# Patient Record
Sex: Female | Born: 1940 | Race: White | Hispanic: No | Smoking: Former smoker
Health system: Southern US, Community
[De-identification: ages and names within clinical notes are randomized; demographics above are authoritative.]

## PROBLEM LIST (undated history)

## (undated) DIAGNOSIS — N179 Acute kidney failure, unspecified: Secondary | ICD-10-CM

## (undated) DIAGNOSIS — I10 Essential (primary) hypertension: Secondary | ICD-10-CM

## (undated) DIAGNOSIS — K5720 Diverticulitis of large intestine with perforation and abscess without bleeding: Secondary | ICD-10-CM

## (undated) DIAGNOSIS — M109 Gout, unspecified: Secondary | ICD-10-CM

## (undated) DIAGNOSIS — M1990 Unspecified osteoarthritis, unspecified site: Secondary | ICD-10-CM

## (undated) DIAGNOSIS — Z8489 Family history of other specified conditions: Secondary | ICD-10-CM

## (undated) DIAGNOSIS — J45909 Unspecified asthma, uncomplicated: Secondary | ICD-10-CM

## (undated) DIAGNOSIS — E119 Type 2 diabetes mellitus without complications: Secondary | ICD-10-CM

## (undated) HISTORY — DX: Acute kidney failure, unspecified: N17.9

## (undated) HISTORY — PX: KNEE SURGERY: SHX244

## (undated) HISTORY — PX: ABDOMINAL HYSTERECTOMY: SHX81

## (undated) HISTORY — DX: Diverticulitis of large intestine with perforation and abscess without bleeding: K57.20

---

## 2014-05-21 DIAGNOSIS — E119 Type 2 diabetes mellitus without complications: Secondary | ICD-10-CM | POA: Diagnosis not present

## 2014-06-15 DIAGNOSIS — Z0100 Encounter for examination of eyes and vision without abnormal findings: Secondary | ICD-10-CM | POA: Diagnosis not present

## 2014-12-22 ENCOUNTER — Emergency Department
Admission: EM | Admit: 2014-12-22 | Discharge: 2014-12-22 | Disposition: A | Discharge status: Home / Self Care | Payer: Commercial Managed Care - HMO | Attending: Student | Admitting: Student

## 2014-12-22 ENCOUNTER — Encounter: Payer: Self-pay | Admitting: Emergency Medicine

## 2014-12-22 DIAGNOSIS — M545 Low back pain: Secondary | ICD-10-CM | POA: Diagnosis not present

## 2014-12-22 DIAGNOSIS — N3000 Acute cystitis without hematuria: Secondary | ICD-10-CM | POA: Insufficient documentation

## 2014-12-22 LAB — URINALYSIS COMPLETE WITH MICROSCOPIC (ARMC ONLY)
BILIRUBIN URINE: NEGATIVE
GLUCOSE, UA: NEGATIVE mg/dL
Hgb urine dipstick: NEGATIVE
Ketones, ur: NEGATIVE mg/dL
Nitrite: NEGATIVE
Protein, ur: NEGATIVE mg/dL
Specific Gravity, Urine: 1.025 (ref 1.005–1.030)
pH: 5 (ref 5.0–8.0)

## 2014-12-22 MED ORDER — TRAMADOL HCL 50 MG PO TABS: 50.0000 mg | ORAL_TABLET | Freq: Four times a day (QID) | ORAL | Status: DC | PRN

## 2014-12-22 MED ORDER — SULFAMETHOXAZOLE-TRIMETHOPRIM 800-160 MG PO TABS: 1.0000 | ORAL_TABLET | Freq: Two times a day (BID) | ORAL | Status: DC

## 2014-12-22 NOTE — ED Provider Notes (Signed)
[facility] Emergency Department Provider Note ____________________________________________  Time seen: Approximately 2:59 PM  I have reviewed the triage vital signs and the nursing notes.   HISTORY  Chief Complaint Back Pain   HPI Brenda Gregory is a 74 y.o. female who presents to the emergency department for evaluation of left lower back pain. She denies injury. She has no dysuria or frequency. She states the pain has progressively worsened over the past 24 hours. The pain does not radiate. She is a Scientist, water quality at [employer]. She states that the pain worsens when she has to stand for long periods and twist or turn certain ways. She has taken Tylenol arthritis without relief. She denies previous back issues.   History reviewed. No pertinent past medical history.  There are no active problems to display for this patient.   Past Surgical History  Procedure Laterality Date  . Cesarean section    . Knee surgery Left     Current Outpatient Rx  Name  Route  Sig  Dispense  Refill  . sulfamethoxazole-trimethoprim (BACTRIM DS,SEPTRA DS) 800-160 MG per tablet   Oral   Take 1 tablet by mouth 2 (two) times daily.   10 tablet   0   . traMADol (ULTRAM) 50 MG tablet   Oral   Take 1 tablet (50 mg total) by mouth every 6 (six) hours as needed.   9 tablet   0     Allergies Review of patient's allergies indicates no known allergies.  No family history on file.  Social History Social History  Substance Use Topics  . Smoking status: Never Smoker   . Smokeless tobacco: None  . Alcohol Use: No    Review of Systems Constitutional: No recent illness. Eyes: No visual changes. ENT: No sore throat. Cardiovascular: Denies chest pain or palpitations. Respiratory: Denies shortness of breath. Gastrointestinal: No abdominal pain.  Genitourinary: Negative for dysuria. Musculoskeletal: Pain in left lower back Skin: Negative for rash. Neurological: Negative for  headaches, focal weakness or numbness. 10-point ROS otherwise negative.  ____________________________________________   PHYSICAL EXAM:  VITAL SIGNS: ED Triage Vitals  Enc Vitals Group     BP 12/22/14 1334 129/75 mmHg     Pulse Rate 12/22/14 1334 71     Resp 12/22/14 1334 16     Temp 12/22/14 1334 98.2 F (36.8 C)     Temp Source 12/22/14 1334 Oral     SpO2 12/22/14 1334 98 %     Weight 12/22/14 1334 225 lb (102.059 kg)     Height 12/22/14 1334 5\' 5"  (1.651 m)     Head Cir --      Peak Flow --      Pain Score 12/22/14 1334 8     Pain Loc --      Pain Edu? --      Excl. in [place]? --     Constitutional: Alert and oriented. Well appearing and in no acute distress. Eyes: Conjunctivae are normal. EOMI. Head: Atraumatic. Nose: No congestion/rhinnorhea. Neck: No stridor.  Respiratory: Normal respiratory effort.   Musculoskeletal: Left lower back pain reproducible with straight leg raise at 30. No CVA tenderness elicited. Neurologic:  Normal speech and language. No gross focal neurologic deficits are appreciated. Speech is normal. No gait instability. Skin:  Skin is warm, dry and intact. Atraumatic. Psychiatric: Mood and affect are normal. Speech and behavior are normal.  ____________________________________________   LABS (all labs ordered are listed, but only abnormal results are displayed)  Labs Reviewed  URINALYSIS COMPLETEWITH MICROSCOPIC ([facility] ONLY) - Abnormal; Notable for the following:    Color, Urine YELLOW (*)    APPearance HAZY (*)    Leukocytes, UA 2+ (*)    Bacteria, UA MANY (*)    Squamous Epithelial / LPF 6-30 (*)    All other components within normal limits   ____________________________________________  RADIOLOGY  Not indicated ____________________________________________   PROCEDURES  Procedure(s) performed: None   ____________________________________________   INITIAL IMPRESSION / ASSESSMENT AND PLAN / ED COURSE  Pertinent labs & imaging  results that were available during my care of the patient were reviewed by me and considered in my medical decision making (see chart for details).  Patient was advised to follow up with the primary care provider in 10-14 days for a repeat urinalysis. She was advised to return to the emergency department for symptoms that change or worsen if unable to schedule an appointment with the primary care provider or specialist. ____________________________________________   FINAL CLINICAL IMPRESSION(S) / ED DIAGNOSES  Final diagnoses:  Acute cystitis without hematuria       Victorino Dike, FNP 12/22/14 1521  Joanne Gavel, MD 12/22/14 1529

## 2014-12-22 NOTE — Discharge Instructions (Signed)

## 2014-12-22 NOTE — ED Notes (Signed)
Patient presents to the ED with back pain x 2 days.  Patient states pain is mainly on the left side of her back.  Patient denies any dysuria or urinary frequency.  Patient states she believes she may have strained her back at work.  Denies fall or major trauma.  Patient is in no obvious distress at this time.

## 2014-12-22 NOTE — ED Notes (Signed)
Pt discharged home after verbalizing understanding of discharge instructions; nad noted. 

## 2015-01-13 DIAGNOSIS — R55 Syncope and collapse: Secondary | ICD-10-CM | POA: Diagnosis not present

## 2015-01-13 DIAGNOSIS — E119 Type 2 diabetes mellitus without complications: Secondary | ICD-10-CM | POA: Diagnosis not present

## 2015-01-13 DIAGNOSIS — M109 Gout, unspecified: Secondary | ICD-10-CM | POA: Diagnosis not present

## 2015-01-13 DIAGNOSIS — N3000 Acute cystitis without hematuria: Secondary | ICD-10-CM | POA: Diagnosis not present

## 2015-01-13 DIAGNOSIS — N179 Acute kidney failure, unspecified: Secondary | ICD-10-CM

## 2015-01-13 DIAGNOSIS — R110 Nausea: Secondary | ICD-10-CM | POA: Diagnosis not present

## 2015-01-13 DIAGNOSIS — R944 Abnormal results of kidney function studies: Secondary | ICD-10-CM | POA: Diagnosis not present

## 2015-01-13 DIAGNOSIS — R0600 Dyspnea, unspecified: Secondary | ICD-10-CM | POA: Diagnosis not present

## 2015-01-13 DIAGNOSIS — I10 Essential (primary) hypertension: Secondary | ICD-10-CM | POA: Diagnosis not present

## 2015-01-13 DIAGNOSIS — E869 Volume depletion, unspecified: Secondary | ICD-10-CM | POA: Diagnosis not present

## 2015-01-13 HISTORY — DX: Acute kidney failure, unspecified: N17.9

## 2015-01-18 ENCOUNTER — Emergency Department
Admission: EM | Admit: 2015-01-18 | Discharge: 2015-01-19 | Disposition: A | Discharge status: Home / Self Care | Payer: Commercial Managed Care - HMO | Attending: Emergency Medicine | Admitting: Emergency Medicine

## 2015-01-18 ENCOUNTER — Encounter: Payer: Self-pay | Admitting: Adult Health

## 2015-01-18 DIAGNOSIS — Y998 Other external cause status: Secondary | ICD-10-CM | POA: Diagnosis not present

## 2015-01-18 DIAGNOSIS — X58XXXA Exposure to other specified factors, initial encounter: Secondary | ICD-10-CM | POA: Diagnosis not present

## 2015-01-18 DIAGNOSIS — Y9389 Activity, other specified: Secondary | ICD-10-CM | POA: Diagnosis not present

## 2015-01-18 DIAGNOSIS — S59911A Unspecified injury of right forearm, initial encounter: Secondary | ICD-10-CM | POA: Diagnosis present

## 2015-01-18 DIAGNOSIS — S161XXA Strain of muscle, fascia and tendon at neck level, initial encounter: Secondary | ICD-10-CM | POA: Insufficient documentation

## 2015-01-18 DIAGNOSIS — Z79899 Other long term (current) drug therapy: Secondary | ICD-10-CM | POA: Insufficient documentation

## 2015-01-18 DIAGNOSIS — Y9289 Other specified places as the place of occurrence of the external cause: Secondary | ICD-10-CM | POA: Insufficient documentation

## 2015-01-18 DIAGNOSIS — T148XXA Other injury of unspecified body region, initial encounter: Secondary | ICD-10-CM

## 2015-01-18 DIAGNOSIS — L7622 Postprocedural hemorrhage and hematoma of skin and subcutaneous tissue following other procedure: Secondary | ICD-10-CM | POA: Insufficient documentation

## 2015-01-18 DIAGNOSIS — S301XXA Contusion of abdominal wall, initial encounter: Secondary | ICD-10-CM | POA: Diagnosis not present

## 2015-01-18 LAB — CBC
HCT: 38.5 % (ref 35.0–47.0)
Hemoglobin: 12.6 g/dL (ref 12.0–16.0)
MCH: 29.9 pg (ref 26.0–34.0)
MCHC: 32.8 g/dL (ref 32.0–36.0)
MCV: 91 fL (ref 80.0–100.0)
PLATELETS: 256 10*3/uL (ref 150–440)
RBC: 4.23 MIL/uL (ref 3.80–5.20)
RDW: 14.6 % — AB (ref 11.5–14.5)
WBC: 12.8 10*3/uL — ABNORMAL HIGH (ref 3.6–11.0)

## 2015-01-18 LAB — BASIC METABOLIC PANEL
Anion gap: 9 (ref 5–15)
BUN: 24 mg/dL — AB (ref 6–20)
CALCIUM: 9.3 mg/dL (ref 8.9–10.3)
CO2: 23 mmol/L (ref 22–32)
Chloride: 101 mmol/L (ref 101–111)
Creatinine, Ser: 1.29 mg/dL — ABNORMAL HIGH (ref 0.44–1.00)
GFR calc Af Amer: 47 mL/min — ABNORMAL LOW (ref >60)
GFR, EST NON AFRICAN AMERICAN: 40 mL/min — AB (ref >60)
GLUCOSE: 186 mg/dL — AB (ref 65–99)
Potassium: 3.9 mmol/L (ref 3.5–5.1)
Sodium: 133 mmol/L — ABNORMAL LOW (ref 135–145)

## 2015-01-18 LAB — TROPONIN I

## 2015-01-18 NOTE — ED Notes (Signed)
Attempted to insert IV 2 x unsuccessful.

## 2015-01-18 NOTE — ED Notes (Signed)
Pt reports having had a UTI at the end of September.  Pt reports going to the [place] fair Sunday, and experienced dizziness. Pt went to [facility], and labs and blood cultures drawn and reports being told she still has a UTI.  Pt was given 300mg  capsules of Cefdinir.  Pt began began having neck stiffness/pain yesterday.  Pt states "it feels kind of like when you sleep wrong".  Pt reports having right arm pain upon waking up this morning.  Pt has bruising to both AC's which she reports is from blood draws at [place].  Pt's daughter reports that pt has not checked her blood sugar today.  She reports that she normally takes metformin, but was told by the MD's at [facility] not to take it until she has her follow-up appointment with her primary care MD.  Pt's appointment with primary care is scheduled for this Monday.

## 2015-01-18 NOTE — ED Notes (Addendum)
Recenly hospitalized for dehydration at [facility] Monday had blood drawn from right arm, today right arm is painful to move at the elbow-pain is described as severe- no swelling noted. Bruising at site. Also c/o neck stiffness on right side. Denies headaches, denies fevers.

## 2015-01-18 NOTE — ED Provider Notes (Signed)
[facility] Emergency Department Provider Note  ____________________________________________  Time seen: Approximately 11:22 PM  I have reviewed the triage vital signs and the nursing notes.   HISTORY  Chief Complaint Arm Pain and Neck Pain    HPI Brenda Gregory is a 74 y.o. female with a history of a urinary tract infection diagnosed a couple weeks ago and then a recent admission to [facility] when she became lightheaded and near syncopal at the [employer] state fair.She was treated for mild dehydration and discharged.  Today she complains that the right side of her neck feels stiff "like when he sleep on it wrong" though it does feel better with icy hot.  Her primary/chief complaint is pain in her right inner elbow at the site where she had an IV was she was in the hospital.  She states it is sore when she tries to move it.  She describes the pain as mild to moderate and worse with movement and better with rest.  The onset has been gradual.  She denies fever/chills, chest pain, shortness of breath, abdominal pain, dysuria, nausea/vomiting.  She states that she still feels a little bit weak after her recent hospitalization.  She also denies headaches and visual symptoms.   History reviewed. No pertinent past medical history.  There are no active problems to display for this patient.   Past Surgical History  Procedure Laterality Date  . Cesarean section    . Knee surgery Left     Current Outpatient Rx  Name  Route  Sig  Dispense  Refill  . sulfamethoxazole-trimethoprim (BACTRIM DS,SEPTRA DS) 800-160 MG per tablet   Oral   Take 1 tablet by mouth 2 (two) times daily.   10 tablet   0   . traMADol (ULTRAM) 50 MG tablet   Oral   Take 1 tablet (50 mg total) by mouth every 6 (six) hours as needed.   9 tablet   0     Allergies Review of patient's allergies indicates no known allergies.  History reviewed. No pertinent family  history.  Social History Social History  Substance Use Topics  . Smoking status: Never Smoker   . Smokeless tobacco: None  . Alcohol Use: No    Review of Systems Constitutional: No fever/chills Eyes: No visual changes. ENT: No sore throat. Cardiovascular: Denies chest pain. Respiratory: Denies shortness of breath. Gastrointestinal: No abdominal pain.  No nausea, no vomiting.  No diarrhea.  No constipation. Genitourinary: Negative for dysuria. Musculoskeletal: Negative for back pain.  Pain in her right antecubital fossa after recent IV during hospitalization at [facility].  Pain in the right side of her neck  Skin: Negative for rash. Neurological: Negative for headaches, focal weakness or numbness.  10-point ROS otherwise negative.  ____________________________________________   PHYSICAL EXAM:  VITAL SIGNS: ED Triage Vitals  Enc Vitals Group     BP 01/18/15 2008 149/76 mmHg     Pulse Rate 01/18/15 2008 94     Resp 01/18/15 2008 18     Temp 01/18/15 2008 98.8 F (37.1 C)     Temp Source 01/18/15 2008 Oral     SpO2 01/18/15 2008 96 %     Weight --      Height --      Head Cir --      Peak Flow --      Pain Score 01/18/15 2008 8     Pain Loc --      Pain Edu? --  Excl. in [place]? --     Constitutional: Alert and oriented. Well appearing and in no acute distress. Eyes: Conjunctivae are normal. PERRL. EOMI. Head: Atraumatic. Nose: No congestion/rhinnorhea. Mouth/Throat: Mucous membranes are moist.  Oropharynx non-erythematous. Neck: No stridor.  No cervical spine tenderness to palpation.  Soft tissue muscle tenderness on the right side.  No meningismus. Cardiovascular: Normal rate, regular rhythm. Grossly normal heart sounds.  Good peripheral circulation. Respiratory: Normal respiratory effort.  No retractions. Lungs CTAB. Gastrointestinal: Soft and nontender. No distention. No abdominal bruits. No CVA tenderness. Musculoskeletal: Ecchymosis/hematoma in the right  antecubital fossa with mild tenderness to palpation and with flexion and extension.  However there is no effusion and no sign of cellulitis or phlebitis.  She is neurovascularly intact distal to the wound. Neurologic:  Normal speech and language. No gross focal neurologic deficits are appreciated.  Skin:  Skin is warm, dry and intact. No rash noted. Psychiatric: Mood and affect are normal. Speech and behavior are normal.  ____________________________________________   LABS (all labs ordered are listed, but only abnormal results are displayed)  Labs Reviewed  BASIC METABOLIC PANEL - Abnormal; Notable for the following:    Sodium 133 (*)    Glucose, Bld 186 (*)    BUN 24 (*)    Creatinine, Ser 1.29 (*)    GFR calc non Af Amer 40 (*)    GFR calc Af Amer 47 (*)    All other components within normal limits  CBC - Abnormal; Notable for the following:    WBC 12.8 (*)    RDW 14.6 (*)    All other components within normal limits  TROPONIN I   ____________________________________________  EKG  ED ECG REPORT I, Dusty Wagoner, the attending physician, personally viewed and interpreted this ECG.  Date: 01/19/2015 EKG Time: 22:45 Rate: 86 Rhythm: normal sinus rhythm QRS Axis: Left axis deviation Intervals: normal ST/T Wave abnormalities: normal Conduction Disutrbances: none Narrative Interpretation: unremarkable  ____________________________________________  RADIOLOGY   No results found.  ____________________________________________   PROCEDURES  Procedure(s) performed: None  Critical Care performed: No ____________________________________________   INITIAL IMPRESSION / ASSESSMENT AND PLAN / ED COURSE  Pertinent labs & imaging results that were available during my care of the patient were reviewed by me and considered in my medical decision making (see chart for details).  The patient is well-appearing and in no acute distress with normal vital signs.  Her labs are  within normal limits; the leukocytosis is nonspecific but not concerning at this time.  She is currently on antibiotics to treat her urinary tract infection.  She has a follow-up appointment in 2 days with her primary care doctor.  I offered radiographs of the right elbow but explained that they were not really indicated and that she should use elevation, compression, and cold packs to help with a hematoma after her peripheral IV stick.  She understands and agrees with the plan.  I gave her my usual and customary return precautions.  ____________________________________________  FINAL CLINICAL IMPRESSION(S) / ED DIAGNOSES  Final diagnoses:  Hematoma  Muscle strain      NEW MEDICATIONS STARTED DURING THIS VISIT:  New Prescriptions   No medications on file     Hinda Kehr, MD 01/19/15 [phone]

## 2015-01-19 NOTE — ED Notes (Signed)
Reviewed d/c instructions, follow-up care, and medications with pt.  Pt verbalized understanding.

## 2015-01-19 NOTE — Discharge Instructions (Signed)
As we discussed, your workup today was reassuring.  Use elevation, compression, and ice packs to help the swelling in your right inner elbow, and keep doing what you are doing for the muscle stiffness in your neck.  Follow up with your PCP on Monday as scheduled.  Please return immediately to the Emergency Department if you develop any new or worsening symptoms that concern you.

## 2015-01-21 DIAGNOSIS — N390 Urinary tract infection, site not specified: Secondary | ICD-10-CM | POA: Diagnosis not present

## 2015-01-21 DIAGNOSIS — N183 Chronic kidney disease, stage 3 unspecified: Secondary | ICD-10-CM | POA: Insufficient documentation

## 2015-01-21 DIAGNOSIS — E119 Type 2 diabetes mellitus without complications: Secondary | ICD-10-CM | POA: Diagnosis not present

## 2015-01-21 DIAGNOSIS — J45909 Unspecified asthma, uncomplicated: Secondary | ICD-10-CM | POA: Insufficient documentation

## 2015-01-21 DIAGNOSIS — M542 Cervicalgia: Secondary | ICD-10-CM | POA: Diagnosis not present

## 2015-01-21 DIAGNOSIS — Z79899 Other long term (current) drug therapy: Secondary | ICD-10-CM | POA: Diagnosis not present

## 2015-01-21 DIAGNOSIS — Z23 Encounter for immunization: Secondary | ICD-10-CM | POA: Diagnosis not present

## 2015-04-15 DIAGNOSIS — I10 Essential (primary) hypertension: Secondary | ICD-10-CM | POA: Diagnosis not present

## 2015-04-15 DIAGNOSIS — Z79899 Other long term (current) drug therapy: Secondary | ICD-10-CM | POA: Diagnosis not present

## 2015-04-15 DIAGNOSIS — E119 Type 2 diabetes mellitus without complications: Secondary | ICD-10-CM | POA: Diagnosis not present

## 2015-04-15 DIAGNOSIS — E1122 Type 2 diabetes mellitus with diabetic chronic kidney disease: Secondary | ICD-10-CM | POA: Diagnosis not present

## 2015-04-15 DIAGNOSIS — N183 Chronic kidney disease, stage 3 (moderate): Secondary | ICD-10-CM | POA: Diagnosis not present

## 2015-07-02 ENCOUNTER — Emergency Department: Payer: Commercial Managed Care - HMO

## 2015-07-02 ENCOUNTER — Encounter: Payer: Self-pay | Admitting: Emergency Medicine

## 2015-07-02 DIAGNOSIS — M25561 Pain in right knee: Secondary | ICD-10-CM | POA: Insufficient documentation

## 2015-07-02 DIAGNOSIS — Z5321 Procedure and treatment not carried out due to patient leaving prior to being seen by health care provider: Secondary | ICD-10-CM | POA: Diagnosis not present

## 2015-07-02 NOTE — ED Notes (Signed)
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

## 2015-07-03 ENCOUNTER — Emergency Department
Admission: EM | Admit: 2015-07-03 | Discharge: 2015-07-03 | Disposition: A | Discharge status: Home / Self Care | Payer: Commercial Managed Care - HMO | Attending: Emergency Medicine | Admitting: Emergency Medicine

## 2015-07-03 NOTE — ED Notes (Signed)
Pt sitting in flex lobby with family member; no distress noted; updated on wait time & voices understanding

## 2015-10-08 DIAGNOSIS — E1122 Type 2 diabetes mellitus with diabetic chronic kidney disease: Secondary | ICD-10-CM | POA: Diagnosis not present

## 2015-10-08 DIAGNOSIS — N183 Chronic kidney disease, stage 3 (moderate): Secondary | ICD-10-CM | POA: Diagnosis not present

## 2015-10-08 DIAGNOSIS — Z79899 Other long term (current) drug therapy: Secondary | ICD-10-CM | POA: Diagnosis not present

## 2015-10-22 DIAGNOSIS — Z6841 Body Mass Index (BMI) 40.0 and over, adult: Secondary | ICD-10-CM | POA: Diagnosis not present

## 2015-10-22 DIAGNOSIS — Z79899 Other long term (current) drug therapy: Secondary | ICD-10-CM | POA: Diagnosis not present

## 2015-10-22 DIAGNOSIS — Z1211 Encounter for screening for malignant neoplasm of colon: Secondary | ICD-10-CM | POA: Diagnosis not present

## 2015-10-22 DIAGNOSIS — N183 Chronic kidney disease, stage 3 (moderate): Secondary | ICD-10-CM | POA: Diagnosis not present

## 2015-10-22 DIAGNOSIS — E1122 Type 2 diabetes mellitus with diabetic chronic kidney disease: Secondary | ICD-10-CM | POA: Diagnosis not present

## 2015-10-22 DIAGNOSIS — Z0000 Encounter for general adult medical examination without abnormal findings: Secondary | ICD-10-CM | POA: Diagnosis not present

## 2015-12-03 DIAGNOSIS — H5213 Myopia, bilateral: Secondary | ICD-10-CM | POA: Diagnosis not present

## 2015-12-03 DIAGNOSIS — H52209 Unspecified astigmatism, unspecified eye: Secondary | ICD-10-CM | POA: Diagnosis not present

## 2015-12-03 DIAGNOSIS — Z0100 Encounter for examination of eyes and vision without abnormal findings: Secondary | ICD-10-CM | POA: Diagnosis not present

## 2015-12-03 DIAGNOSIS — H524 Presbyopia: Secondary | ICD-10-CM | POA: Diagnosis not present

## 2015-12-03 DIAGNOSIS — H5210 Myopia, unspecified eye: Secondary | ICD-10-CM | POA: Diagnosis not present

## 2016-01-03 DIAGNOSIS — H2513 Age-related nuclear cataract, bilateral: Secondary | ICD-10-CM | POA: Diagnosis not present

## 2016-02-18 DIAGNOSIS — H2513 Age-related nuclear cataract, bilateral: Secondary | ICD-10-CM | POA: Diagnosis not present

## 2016-02-18 NOTE — Discharge Instructions (Signed)
Cataract Surgery, Care After °Refer to this sheet in the next few weeks. These instructions provide you with information about caring for yourself after your procedure. Your health care provider may also give you more specific instructions. Your treatment has been planned according to current medical practices, but problems sometimes occur. Call your health care provider if you have any problems or questions after your procedure. °What can I expect after the procedure? °After the procedure, it is common to have: °· Itching. °· Discomfort. °· Fluid discharge. °· Sensitivity to light and to touch. °· Bruising. °Follow these instructions at home: °Eye Care  °· Check your eye every day for signs of infection. Watch for: °¨ Redness, swelling, or pain. °¨ Fluid, blood, or pus. °¨ Warmth. °¨ Bad smell. °Activity  °· Avoid strenuous activities, such as playing contact sports, for as long as told by your health care provider. °· Do not drive or operate heavy machinery until your health care provider approves. °· Do not bend or lift heavy objects . Bending increases pressure in the eye. You can walk, climb stairs, and do light household chores. °· Ask your health care provider when you can return to work. If you work in a dusty environment, you may be advised to wear protective eyewear for a period of time. °General instructions  °· Take or apply over-the-counter and prescription medicines only as told by your health care provider. This includes eye drops. °· Do not touch or rub your eyes. °· If you were given a protective shield, wear it as told by your health care provider. If you were not given a protective shield, wear sunglasses as told by your health care provider to protect your eyes. °· Keep the area around your eye clean and dry. Avoid swimming or allowing water to hit you directly in the face while showering until told by your health care provider. Keep soap and shampoo out of your eyes. °· Do not put a contact lens  into the affected eye or eyes until your health care provider approves. °· Keep all follow-up visits as told by your health care provider. This is important. °Contact a health care provider if: ° °· You have increased bruising around your eye. °· You have pain that is not helped with medicine. °· You have a fever. °· You have redness, swelling, or pain in your eye. °· You have fluid, blood, or pus coming from your incision. °· Your vision gets worse. °Get help right away if: °· You have sudden vision loss. °This information is not intended to replace advice given to you by your health care provider. Make sure you discuss any questions you have with your health care provider. °Document Released: 10/03/2004 Document Revised: 07/25/2015 Document Reviewed: 01/24/2015 °Elsevier Interactive Patient Education © 2017 [employer] ° ° ° ° °General Anesthesia, Adult, Care After °These instructions provide you with information about caring for yourself after your procedure. Your health care provider may also give you more specific instructions. Your treatment has been planned according to current medical practices, but problems sometimes occur. Call your health care provider if you have any problems or questions after your procedure. °What can I expect after the procedure? °After the procedure, it is common to have: °· Vomiting. °· A sore throat. °· Mental slowness. °It is common to feel: °· Nauseous. °· Cold or shivery. °· Sleepy. °· Tired. °· Sore or achy, even in parts of your body where you did not have surgery. °Follow these instructions at   home: °For at least 24 hours after the procedure:  °· Do not: °¨ Participate in activities where you could fall or become injured. °¨ Drive. °¨ Use heavy machinery. °¨ Drink alcohol. °¨ Take sleeping pills or medicines that cause drowsiness. °¨ Make important decisions or sign legal documents. °¨ Take care of children on your own. °· Rest. °Eating and drinking  °· If you vomit, drink  water, juice, or soup when you can drink without vomiting. °· Drink enough fluid to keep your urine clear or pale yellow. °· Make sure you have little or no nausea before eating solid foods. °· Follow the diet recommended by your health care provider. °General instructions  °· Have a responsible adult stay with you until you are awake and alert. °· Return to your normal activities as told by your health care provider. Ask your health care provider what activities are safe for you. °· Take over-the-counter and prescription medicines only as told by your health care provider. °· If you smoke, do not smoke without supervision. °· Keep all follow-up visits as told by your health care provider. This is important. °Contact a health care provider if: °· You continue to have nausea or vomiting at home, and medicines are not helpful. °· You cannot drink fluids or start eating again. °· You cannot urinate after 8-12 hours. °· You develop a skin rash. °· You have fever. °· You have increasing redness at the site of your procedure. °Get help right away if: °· You have difficulty breathing. °· You have chest pain. °· You have unexpected bleeding. °· You feel that you are having a life-threatening or urgent problem. °This information is not intended to replace advice given to you by your health care provider. Make sure you discuss any questions you have with your health care provider. °Document Released: 06/22/2000 Document Revised: 08/19/2015 Document Reviewed: 02/28/2015 °Elsevier Interactive Patient Education © 2017 Elsevier Inc[employer] ° °

## 2016-02-19 ENCOUNTER — Encounter: Payer: Self-pay | Admitting: *Deleted

## 2016-02-25 ENCOUNTER — Encounter
Admission: RE | Disposition: A | Discharge status: Home / Self Care | Payer: Self-pay | Source: Ambulatory Visit | Attending: Ophthalmology

## 2016-02-25 ENCOUNTER — Ambulatory Visit
Admission: RE | Admit: 2016-02-25 | Discharge: 2016-02-25 | Disposition: A | Discharge status: Home / Self Care | Payer: Commercial Managed Care - HMO | Source: Ambulatory Visit | Attending: Ophthalmology | Admitting: Ophthalmology

## 2016-02-25 ENCOUNTER — Ambulatory Visit: Payer: Commercial Managed Care - HMO | Admitting: Anesthesiology

## 2016-02-25 DIAGNOSIS — E1136 Type 2 diabetes mellitus with diabetic cataract: Secondary | ICD-10-CM | POA: Diagnosis not present

## 2016-02-25 DIAGNOSIS — I10 Essential (primary) hypertension: Secondary | ICD-10-CM | POA: Insufficient documentation

## 2016-02-25 DIAGNOSIS — Z87891 Personal history of nicotine dependence: Secondary | ICD-10-CM | POA: Insufficient documentation

## 2016-02-25 DIAGNOSIS — J45909 Unspecified asthma, uncomplicated: Secondary | ICD-10-CM | POA: Insufficient documentation

## 2016-02-25 DIAGNOSIS — M1990 Unspecified osteoarthritis, unspecified site: Secondary | ICD-10-CM | POA: Insufficient documentation

## 2016-02-25 DIAGNOSIS — H2513 Age-related nuclear cataract, bilateral: Secondary | ICD-10-CM | POA: Diagnosis not present

## 2016-02-25 DIAGNOSIS — H2512 Age-related nuclear cataract, left eye: Secondary | ICD-10-CM | POA: Diagnosis not present

## 2016-02-25 HISTORY — DX: Unspecified osteoarthritis, unspecified site: M19.90

## 2016-02-25 HISTORY — DX: Essential (primary) hypertension: I10

## 2016-02-25 HISTORY — PX: CATARACT EXTRACTION W/PHACO: SHX586

## 2016-02-25 HISTORY — DX: Type 2 diabetes mellitus without complications: E11.9

## 2016-02-25 HISTORY — DX: Unspecified asthma, uncomplicated: J45.909

## 2016-02-25 HISTORY — DX: Gout, unspecified: M10.9

## 2016-02-25 LAB — GLUCOSE, CAPILLARY: GLUCOSE-CAPILLARY: 140 mg/dL — AB (ref 65–99)

## 2016-02-25 SURGERY — PHACOEMULSIFICATION, CATARACT, WITH IOL INSERTION
Anesthesia: Monitor Anesthesia Care | Laterality: Left | Wound class: Clean

## 2016-02-25 MED ORDER — ARMC OPHTHALMIC DILATING DROPS
1.0000 "application " | OPHTHALMIC | Status: DC | PRN
  Administered 2016-02-25 (×3): 1 via OPHTHALMIC

## 2016-02-25 MED ORDER — SODIUM HYALURONATE 10 MG/ML IO SOLN
INTRAOCULAR | Status: DC | PRN
  Administered 2016-02-25: 0.85 mL via INTRAOCULAR

## 2016-02-25 MED ORDER — BSS IO SOLN
INTRAOCULAR | Status: DC | PRN
  Administered 2016-02-25: 1 mL via OPHTHALMIC

## 2016-02-25 MED ORDER — FENTANYL CITRATE (PF) 100 MCG/2ML IJ SOLN
INTRAMUSCULAR | Status: DC | PRN
Start: 2016-02-25 — End: 2016-02-25
  Administered 2016-02-25: 50 ug via INTRAVENOUS

## 2016-02-25 MED ORDER — SODIUM HYALURONATE 23 MG/ML IO SOLN
INTRAOCULAR | Status: DC | PRN
  Administered 2016-02-25: 0.6 mL via INTRAOCULAR

## 2016-02-25 MED ORDER — MOXIFLOXACIN HCL 0.5 % OP SOLN
OPHTHALMIC | Status: DC | PRN
  Administered 2016-02-25: 1 [drp] via OPHTHALMIC

## 2016-02-25 MED ORDER — EPINEPHRINE PF 1 MG/ML IJ SOLN
INTRAOCULAR | Status: DC | PRN
  Administered 2016-02-25: 89 mL via OPHTHALMIC

## 2016-02-25 MED ORDER — MIDAZOLAM HCL 2 MG/2ML IJ SOLN
INTRAMUSCULAR | Status: DC | PRN
Start: 2016-02-25 — End: 2016-02-25
  Administered 2016-02-25: 2 mg via INTRAVENOUS

## 2016-02-25 SURGICAL SUPPLY — 19 items
CANNULA ANT/CHMB 27GA (MISCELLANEOUS) ×3 IMPLANT
CUP MEDICINE 2OZ PLAST GRAD ST (MISCELLANEOUS) ×3 IMPLANT
DISSECTOR HYDRO NUCLEUS 50X22 (MISCELLANEOUS) ×3 IMPLANT
GLOVE BIO SURGEON STRL SZ8 (GLOVE) ×3 IMPLANT
GLOVE SURG LX 7.5 STRW (GLOVE) ×2
GLOVE SURG LX STRL 7.5 STRW (GLOVE) ×1 IMPLANT
GOWN STRL REUS W/ TWL LRG LVL3 (GOWN DISPOSABLE) ×2 IMPLANT
GOWN STRL REUS W/TWL LRG LVL3 (GOWN DISPOSABLE) ×4
LENS IOL TECNIS ITEC 16.0 (Intraocular Lens) ×3 IMPLANT
MARKER SKIN DUAL TIP RULER LAB (MISCELLANEOUS) ×3 IMPLANT
PACK CATARACT (MISCELLANEOUS) ×3 IMPLANT
PACK CATARACT BRASINGTON (MISCELLANEOUS) ×3 IMPLANT
PACK EYE AFTER SURG (MISCELLANEOUS) ×3 IMPLANT
SOL PREP PVP 2OZ (MISCELLANEOUS) ×3
SOLUTION PREP PVP 2OZ (MISCELLANEOUS) ×1 IMPLANT
SYR 3ML LL SCALE MARK (SYRINGE) ×3 IMPLANT
SYR TB 1ML LUER SLIP (SYRINGE) ×3 IMPLANT
WATER STERILE IRR 250ML POUR (IV SOLUTION) ×3 IMPLANT
WIPE NON LINTING 3.25X3.25 (MISCELLANEOUS) ×3 IMPLANT

## 2016-02-25 NOTE — Op Note (Signed)
OPERATIVE NOTE  Brenda Gregory [id number] 02/25/2016   PREOPERATIVE DIAGNOSIS:  Nuclear sclerotic cataract left eye.  H25.12   POSTOPERATIVE DIAGNOSIS:    Nuclear sclerotic cataract left eye.     PROCEDURE:  Phacoemusification with posterior chamber intraocular lens placement of the left eye   LENS:   Implant Name Type Inv. Item Serial No. Manufacturer Lot No. LRB No. Used  LENS IOL DIOP 16.0 - [id number] Intraocular Lens LENS IOL DIOP 16.0 [id number] AMO   Left 1       PCB00 +16.0 D   ULTRASOUND TIME: 0 minutes 31.6 seconds.  CDE 4.45   SURGEON:  Benay Pillow, MD, MPH   ANESTHESIA:  Topical with tetracaine drops augmented with 1% preservative-free intracameral lidocaine.   COMPLICATIONS:  None.   DESCRIPTION OF PROCEDURE:  The patient was identified in the holding room and transported to the operating room and placed in the supine position under the operating microscope.  The left eye was identified as the operative eye and it was prepped and draped in the usual sterile ophthalmic fashion.   A 1.0 millimeter clear-corneal paracentesis was made at the 5:00 position. 0.5 ml of preservative-free 1% lidocaine with epinephrine was injected into the anterior chamber.  The anterior chamber was filled with Healon 5 viscoelastic.  A 2.4 millimeter keratome was used to make a near-clear corneal incision at the 2:00 position.  A curvilinear capsulorrhexis was made with a cystotome and capsulorrhexis forceps.  Balanced salt solution was used to hydrodissect and hydrodelineate the nucleus.   Phacoemulsification was then used in stop and chop fashion to remove the lens nucleus and epinucleus.  The remaining cortex was then removed using the irrigation and aspiration handpiece. Healon was then placed into the capsular bag to distend it for lens placement.  A lens was then injected into the capsular bag.  The remaining viscoelastic was aspirated.   Wounds were hydrated with balanced salt  solution.  The anterior chamber was inflated to a physiologic pressure with balanced salt solution.   Intracameral vigamox 0.1 mL undiltued was injected into the eye and a drop placed onto the ocular surface.  No wound leaks were noted.  The patient was taken to the recovery room in stable condition without complications of anesthesia or surgery  Benay Pillow 02/25/2016, 9:55 AM

## 2016-02-25 NOTE — Anesthesia Postprocedure Evaluation (Signed)
Anesthesia Post Note  Patient: Brenda Gregory  Procedure(s) Performed: Procedure(s) (LRB): CATARACT EXTRACTION PHACO AND INTRAOCULAR LENS PLACEMENT (IOC) (Left)  Patient location during evaluation: PACU Anesthesia Type: MAC Level of consciousness: awake and alert Pain management: pain level controlled Vital Signs Assessment: post-procedure vital signs reviewed and stable Respiratory status: spontaneous breathing, nonlabored ventilation, respiratory function stable and patient connected to nasal cannula oxygen Cardiovascular status: stable and blood pressure returned to baseline Anesthetic complications: no    Alisa Graff

## 2016-02-25 NOTE — Anesthesia Preprocedure Evaluation (Signed)
Anesthesia Evaluation  Patient identified by MRN, date of birth, ID band Patient awake    Reviewed: Allergy & Precautions, H&P , NPO status , Patient's Chart, lab work & pertinent test results, reviewed documented beta blocker date and time   Airway Mallampati: II  TM Distance: >3 FB Neck ROM: full    Dental no notable dental hx.    Pulmonary asthma , former smoker,    Pulmonary exam normal breath sounds clear to auscultation       Cardiovascular Exercise Tolerance: Good hypertension,  Rhythm:regular Rate:Normal     Neuro/Psych negative neurological ROS  negative psych ROS   GI/Hepatic negative GI ROS, Neg liver ROS,   Endo/Other  diabetes  Renal/GU negative Renal ROS  negative genitourinary   Musculoskeletal  (+) Arthritis ,   Abdominal   Peds  Hematology negative hematology ROS (+)   Anesthesia Other Findings   Reproductive/Obstetrics negative OB ROS                             Anesthesia Physical Anesthesia Plan  ASA: II  Anesthesia Plan: MAC   Post-op Pain Management:    Induction:   Airway Management Planned:   Additional Equipment:   Intra-op Plan:   Post-operative Plan:   Informed Consent: I have reviewed the patients History and Physical, chart, labs and discussed the procedure including the risks, benefits and alternatives for the proposed anesthesia with the patient or authorized representative who has indicated his/her understanding and acceptance.   Dental Advisory Given  Plan Discussed with: CRNA  Anesthesia Plan Comments:         Anesthesia Quick Evaluation

## 2016-02-25 NOTE — H&P (Signed)
The History and Physical notes are on paper, have been signed, and are to be scanned. The patient remains stable and unchanged from the H&P.   Previous H&P reviewed, patient examined, and there are no changes.  Brenda Gregory 02/25/2016 9:03 AM

## 2016-02-25 NOTE — Transfer of Care (Signed)
Immediate Anesthesia Transfer of Care Note  Patient: Brenda Gregory  Procedure(s) Performed: Procedure(s) with comments: CATARACT EXTRACTION PHACO AND INTRAOCULAR LENS PLACEMENT (IOC) (Left) - DIABETIC - oral meds LEFT  Patient Location: PACU  Anesthesia Type: MAC  Level of Consciousness: awake, alert  and patient cooperative  Airway and Oxygen Therapy: Patient Spontanous Breathing and Patient connected to supplemental oxygen  Post-op Assessment: Post-op Vital signs reviewed, Patient's Cardiovascular Status Stable, Respiratory Function Stable, Patent Airway and No signs of Nausea or vomiting  Post-op Vital Signs: Reviewed and stable  Complications: No apparent anesthesia complications

## 2016-02-25 NOTE — Anesthesia Procedure Notes (Signed)
Procedure Name: MAC Performed by: Dietra Stokely Pre-anesthesia Checklist: Patient identified, Emergency Drugs available, Suction available, Timeout performed and Patient being monitored Patient Re-evaluated:Patient Re-evaluated prior to inductionOxygen Delivery Method: Nasal cannula Placement Confirmation: positive ETCO2     

## 2016-02-26 ENCOUNTER — Encounter: Payer: Self-pay | Admitting: Ophthalmology

## 2016-03-27 DIAGNOSIS — H2511 Age-related nuclear cataract, right eye: Secondary | ICD-10-CM | POA: Diagnosis not present

## 2016-03-31 ENCOUNTER — Encounter: Payer: Self-pay | Admitting: *Deleted

## 2016-04-01 ENCOUNTER — Encounter: Payer: Self-pay | Admitting: Anesthesiology

## 2016-04-06 NOTE — Discharge Instructions (Signed)
Cataract Surgery, Care After °Refer to this sheet in the next few weeks. These instructions provide you with information about caring for yourself after your procedure. Your health care provider may also give you more specific instructions. Your treatment has been planned according to current medical practices, but problems sometimes occur. Call your health care provider if you have any problems or questions after your procedure. °What can I expect after the procedure? °After the procedure, it is common to have: °· Itching. °· Discomfort. °· Fluid discharge. °· Sensitivity to light and to touch. °· Bruising. °Follow these instructions at home: °Eye Care  °· Check your eye every day for signs of infection. Watch for: °¨ Redness, swelling, or pain. °¨ Fluid, blood, or pus. °¨ Warmth. °¨ Bad smell. °Activity  °· Avoid strenuous activities, such as playing contact sports, for as long as told by your health care provider. °· Do not drive or operate heavy machinery until your health care provider approves. °· Do not bend or lift heavy objects . Bending increases pressure in the eye. You can walk, climb stairs, and do light household chores. °· Ask your health care provider when you can return to work. If you work in a dusty environment, you may be advised to wear protective eyewear for a period of time. °General instructions  °· Take or apply over-the-counter and prescription medicines only as told by your health care provider. This includes eye drops. °· Do not touch or rub your eyes. °· If you were given a protective shield, wear it as told by your health care provider. If you were not given a protective shield, wear sunglasses as told by your health care provider to protect your eyes. °· Keep the area around your eye clean and dry. Avoid swimming or allowing water to hit you directly in the face while showering until told by your health care provider. Keep soap and shampoo out of your eyes. °· Do not put a contact lens  into the affected eye or eyes until your health care provider approves. °· Keep all follow-up visits as told by your health care provider. This is important. °Contact a health care provider if: ° °· You have increased bruising around your eye. °· You have pain that is not helped with medicine. °· You have a fever. °· You have redness, swelling, or pain in your eye. °· You have fluid, blood, or pus coming from your incision. °· Your vision gets worse. °Get help right away if: °· You have sudden vision loss. °This information is not intended to replace advice given to you by your health care provider. Make sure you discuss any questions you have with your health care provider. °Document Released: 10/03/2004 Document Revised: 07/25/2015 Document Reviewed: 01/24/2015 °Elsevier Interactive Patient Education © 2017 [employer] ° ° ° ° °General Anesthesia, Adult, Care After °These instructions provide you with information about caring for yourself after your procedure. Your health care provider may also give you more specific instructions. Your treatment has been planned according to current medical practices, but problems sometimes occur. Call your health care provider if you have any problems or questions after your procedure. °What can I expect after the procedure? °After the procedure, it is common to have: °· Vomiting. °· A sore throat. °· Mental slowness. °It is common to feel: °· Nauseous. °· Cold or shivery. °· Sleepy. °· Tired. °· Sore or achy, even in parts of your body where you did not have surgery. °Follow these instructions at   home: °For at least 24 hours after the procedure:  °· Do not: °¨ Participate in activities where you could fall or become injured. °¨ Drive. °¨ Use heavy machinery. °¨ Drink alcohol. °¨ Take sleeping pills or medicines that cause drowsiness. °¨ Make important decisions or sign legal documents. °¨ Take care of children on your own. °· Rest. °Eating and drinking  °· If you vomit, drink  water, juice, or soup when you can drink without vomiting. °· Drink enough fluid to keep your urine clear or pale yellow. °· Make sure you have little or no nausea before eating solid foods. °· Follow the diet recommended by your health care provider. °General instructions  °· Have a responsible adult stay with you until you are awake and alert. °· Return to your normal activities as told by your health care provider. Ask your health care provider what activities are safe for you. °· Take over-the-counter and prescription medicines only as told by your health care provider. °· If you smoke, do not smoke without supervision. °· Keep all follow-up visits as told by your health care provider. This is important. °Contact a health care provider if: °· You continue to have nausea or vomiting at home, and medicines are not helpful. °· You cannot drink fluids or start eating again. °· You cannot urinate after 8-12 hours. °· You develop a skin rash. °· You have fever. °· You have increasing redness at the site of your procedure. °Get help right away if: °· You have difficulty breathing. °· You have chest pain. °· You have unexpected bleeding. °· You feel that you are having a life-threatening or urgent problem. °This information is not intended to replace advice given to you by your health care provider. Make sure you discuss any questions you have with your health care provider. °Document Released: 06/22/2000 Document Revised: 08/19/2015 Document Reviewed: 02/28/2015 °Elsevier Interactive Patient Education © 2017 Elsevier Inc[employer] ° °

## 2016-04-07 ENCOUNTER — Encounter
Admission: RE | Disposition: A | Discharge status: Home / Self Care | Payer: Self-pay | Source: Ambulatory Visit | Attending: Ophthalmology

## 2016-04-07 ENCOUNTER — Ambulatory Visit: Payer: Commercial Managed Care - HMO | Admitting: Anesthesiology

## 2016-04-07 ENCOUNTER — Ambulatory Visit
Admission: RE | Admit: 2016-04-07 | Discharge: 2016-04-07 | Disposition: A | Discharge status: Home / Self Care | Payer: Commercial Managed Care - HMO | Source: Ambulatory Visit | Attending: Ophthalmology | Admitting: Ophthalmology

## 2016-04-07 DIAGNOSIS — E119 Type 2 diabetes mellitus without complications: Secondary | ICD-10-CM | POA: Diagnosis not present

## 2016-04-07 DIAGNOSIS — Z79899 Other long term (current) drug therapy: Secondary | ICD-10-CM | POA: Insufficient documentation

## 2016-04-07 DIAGNOSIS — I10 Essential (primary) hypertension: Secondary | ICD-10-CM | POA: Insufficient documentation

## 2016-04-07 DIAGNOSIS — M109 Gout, unspecified: Secondary | ICD-10-CM | POA: Insufficient documentation

## 2016-04-07 DIAGNOSIS — Z7984 Long term (current) use of oral hypoglycemic drugs: Secondary | ICD-10-CM | POA: Diagnosis not present

## 2016-04-07 DIAGNOSIS — H2511 Age-related nuclear cataract, right eye: Secondary | ICD-10-CM | POA: Diagnosis not present

## 2016-04-07 DIAGNOSIS — Z87891 Personal history of nicotine dependence: Secondary | ICD-10-CM | POA: Insufficient documentation

## 2016-04-07 HISTORY — PX: CATARACT EXTRACTION W/PHACO: SHX586

## 2016-04-07 LAB — GLUCOSE, CAPILLARY
GLUCOSE-CAPILLARY: 142 mg/dL — AB (ref 65–99)
Glucose-Capillary: 118 mg/dL — ABNORMAL HIGH (ref 65–99)

## 2016-04-07 SURGERY — PHACOEMULSIFICATION, CATARACT, WITH IOL INSERTION
Anesthesia: Monitor Anesthesia Care | Site: Eye | Laterality: Right | Wound class: Clean

## 2016-04-07 MED ORDER — BSS IO SOLN
INTRAOCULAR | Status: DC | PRN
  Administered 2016-04-07: 2 mL via OPHTHALMIC

## 2016-04-07 MED ORDER — LACTATED RINGERS IV SOLN: INTRAVENOUS | Status: DC

## 2016-04-07 MED ORDER — LACTATED RINGERS IV SOLN: 500.0000 mL | INTRAVENOUS | Status: DC

## 2016-04-07 MED ORDER — ARMC OPHTHALMIC DILATING DROPS
1.0000 "application " | OPHTHALMIC | Status: DC | PRN
  Administered 2016-04-07 (×3): 1 via OPHTHALMIC

## 2016-04-07 MED ORDER — MOXIFLOXACIN HCL 0.5 % OP SOLN
OPHTHALMIC | Status: DC | PRN
  Administered 2016-04-07: 1 [drp] via OPHTHALMIC

## 2016-04-07 MED ORDER — FENTANYL CITRATE (PF) 100 MCG/2ML IJ SOLN
INTRAMUSCULAR | Status: DC | PRN
  Administered 2016-04-07: 100 ug via INTRAVENOUS

## 2016-04-07 MED ORDER — ACETAMINOPHEN 325 MG PO TABS: 325.0000 mg | ORAL_TABLET | ORAL | Status: DC | PRN

## 2016-04-07 MED ORDER — SODIUM HYALURONATE 10 MG/ML IO SOLN
INTRAOCULAR | Status: DC | PRN
  Administered 2016-04-07: .55 mL via INTRAOCULAR

## 2016-04-07 MED ORDER — ACETAMINOPHEN 160 MG/5ML PO SOLN: 325.0000 mg | ORAL | Status: DC | PRN

## 2016-04-07 MED ORDER — SODIUM HYALURONATE 23 MG/ML IO SOLN
INTRAOCULAR | Status: DC | PRN
  Administered 2016-04-07: 0.6 mL via INTRAOCULAR

## 2016-04-07 MED ORDER — MIDAZOLAM HCL 2 MG/2ML IJ SOLN
INTRAMUSCULAR | Status: DC | PRN
  Administered 2016-04-07: 2 mg via INTRAVENOUS

## 2016-04-07 MED ORDER — MOXIFLOXACIN HCL 0.5 % OP SOLN: 1.0000 [drp] | OPHTHALMIC | Status: DC | PRN

## 2016-04-07 MED ORDER — EPINEPHRINE PF 1 MG/ML IJ SOLN
INTRAOCULAR | Status: DC | PRN
  Administered 2016-04-07: 90 mL via OPHTHALMIC

## 2016-04-07 SURGICAL SUPPLY — 19 items
CANNULA ANT/CHMB 27GA (MISCELLANEOUS) ×3 IMPLANT
CUP MEDICINE 2OZ PLAST GRAD ST (MISCELLANEOUS) ×3 IMPLANT
DISSECTOR HYDRO NUCLEUS 50X22 (MISCELLANEOUS) ×3 IMPLANT
GLOVE BIO SURGEON STRL SZ8 (GLOVE) ×3 IMPLANT
GLOVE SURG LX 7.5 STRW (GLOVE) ×2
GLOVE SURG LX STRL 7.5 STRW (GLOVE) ×1 IMPLANT
GOWN STRL REUS W/ TWL LRG LVL3 (GOWN DISPOSABLE) ×2 IMPLANT
GOWN STRL REUS W/TWL LRG LVL3 (GOWN DISPOSABLE) ×4
LENS IOL TECNIS ITEC 17.5 (Intraocular Lens) ×3 IMPLANT
MARKER SKIN DUAL TIP RULER LAB (MISCELLANEOUS) ×3 IMPLANT
PACK CATARACT (MISCELLANEOUS) ×3 IMPLANT
PACK CATARACT BRASINGTON (MISCELLANEOUS) ×3 IMPLANT
PACK EYE AFTER SURG (MISCELLANEOUS) ×3 IMPLANT
SOL PREP PVP 2OZ (MISCELLANEOUS) ×3
SOLUTION PREP PVP 2OZ (MISCELLANEOUS) ×1 IMPLANT
SYR 3ML LL SCALE MARK (SYRINGE) ×3 IMPLANT
SYR TB 1ML LUER SLIP (SYRINGE) ×3 IMPLANT
WATER STERILE IRR 250ML POUR (IV SOLUTION) ×3 IMPLANT
WIPE NON LINTING 3.25X3.25 (MISCELLANEOUS) ×3 IMPLANT

## 2016-04-07 NOTE — H&P (Signed)
The History and Physical notes are on paper, have been signed, and are to be scanned. The patient remains stable and unchanged from the H&P.   Previous H&P reviewed, patient examined, and there are no changes.  Benay Pillow 04/07/2016 8:04 AM

## 2016-04-07 NOTE — Transfer of Care (Signed)
Immediate Anesthesia Transfer of Care Note  Patient: Brenda Gregory  Procedure(s) Performed: Procedure(s) with comments: CATARACT EXTRACTION PHACO AND INTRAOCULAR LENS PLACEMENT (IOC) (Right) - RIGHT DIABETES - oral meds IVA TOPICAL  Patient Location: PACU  Anesthesia Type: MAC  Level of Consciousness: awake, alert  and patient cooperative  Airway and Oxygen Therapy: Patient Spontanous Breathing and Patient connected to supplemental oxygen  Post-op Assessment: Post-op Vital signs reviewed, Patient's Cardiovascular Status Stable, Respiratory Function Stable, Patent Airway and No signs of Nausea or vomiting  Post-op Vital Signs: Reviewed and stable  Complications: No apparent anesthesia complications

## 2016-04-07 NOTE — Anesthesia Postprocedure Evaluation (Signed)
Anesthesia Post Note  Patient: Brenda Gregory  Procedure(s) Performed: Procedure(s) (LRB): CATARACT EXTRACTION PHACO AND INTRAOCULAR LENS PLACEMENT (IOC) (Right)  Patient location during evaluation: PACU Anesthesia Type: MAC Level of consciousness: awake and alert Pain management: pain level controlled Vital Signs Assessment: post-procedure vital signs reviewed and stable Respiratory status: spontaneous breathing, nonlabored ventilation and respiratory function stable Cardiovascular status: stable and blood pressure returned to baseline Anesthetic complications: no    Ehab Humber D Fatoumata Albaugh

## 2016-04-07 NOTE — Anesthesia Procedure Notes (Signed)
Procedure Name: MAC Date/Time: 04/07/2016 8:18 AM Performed by: Janna Arch Pre-anesthesia Checklist: Patient identified, Emergency Drugs available, Suction available and Patient being monitored Patient Re-evaluated:Patient Re-evaluated prior to inductionOxygen Delivery Method: Nasal cannula

## 2016-04-07 NOTE — Op Note (Signed)
OPERATIVE NOTE  SHARMAN GARROTT [id number] 04/07/2016   PREOPERATIVE DIAGNOSIS:  Nuclear sclerotic cataract right eye.  H25.11   POSTOPERATIVE DIAGNOSIS:    Nuclear sclerotic cataract right eye.     PROCEDURE:  Phacoemusification with posterior chamber intraocular lens placement of the right eye   LENS:   Implant Name Type Inv. Item Serial No. Manufacturer Lot No. LRB No. Used  LENS IOL DIOP 17.5 - [id number] Intraocular Lens LENS IOL DIOP 17.5 [id number] AMO   Right 1       PCB00 +17.5   ULTRASOUND TIME: 0 minutes 23 seconds.  CDE 3.87   SURGEON:  Benay Pillow, MD, MPH  ANESTHESIOLOGIST: Anesthesiologist: Esmeralda Links, MD CRNA: Janna Arch, CRNA   ANESTHESIA:  Topical with tetracaine drops augmented with 1% preservative-free intracameral lidocaine.  ESTIMATED BLOOD LOSS: less than 1 mL.   COMPLICATIONS:  None.   DESCRIPTION OF PROCEDURE:  The patient was identified in the holding room and transported to the operating room and placed in the supine position under the operating microscope.  The right eye was identified as the operative eye and it was prepped and draped in the usual sterile ophthalmic fashion.   A 1.0 millimeter clear-corneal paracentesis was made at the 10:30 position. 0.5 ml of preservative-free 1% lidocaine with epinephrine was injected into the anterior chamber.  The anterior chamber was filled with Healon 5 viscoelastic.  A 2.4 millimeter keratome was used to make a near-clear corneal incision at the 8:00 position.  A curvilinear capsulorrhexis was made with a cystotome and capsulorrhexis forceps.  Balanced salt solution was used to hydrodissect and hydrodelineate the nucleus.   Phacoemulsification was then used in stop and chop fashion to remove the lens nucleus and epinucleus.  The remaining cortex was then removed using the irrigation and aspiration handpiece. Healon was then placed into the capsular bag to distend it for lens placement.  A lens  was then injected into the capsular bag.  The remaining viscoelastic was aspirated.   Wounds were hydrated with balanced salt solution.  The anterior chamber was inflated to a physiologic pressure with balanced salt solution.   Intracameral vigamox 0.1 mL undiluted was injected into the eye and a drop placed onto the ocular surface.  No wound leaks were noted.  The patient was taken to the recovery room in stable condition without complications of anesthesia or surgery.  Good routine case.  Benay Pillow 04/07/2016, 8:42 AM

## 2016-04-07 NOTE — Anesthesia Preprocedure Evaluation (Signed)
Anesthesia Evaluation    Reviewed: Allergy & Precautions, H&P , Patient's Chart, lab work & pertinent test results, reviewed documented beta blocker date and time   Airway Mallampati: III  TM Distance: >3 FB Neck ROM: full    Dental   Pulmonary former smoker,    Pulmonary exam normal        Cardiovascular hypertension, Normal cardiovascular exam     Neuro/Psych    GI/Hepatic   Endo/Other  diabetes  Renal/GU      Musculoskeletal   Abdominal   Peds  Hematology   Anesthesia Other Findings   Reproductive/Obstetrics                             Anesthesia Physical Anesthesia Plan  ASA: III  Anesthesia Plan: MAC   Post-op Pain Management:    Induction:   Airway Management Planned:   Additional Equipment:   Intra-op Plan:   Post-operative Plan:   Informed Consent: I have reviewed the patients History and Physical, chart, labs and discussed the procedure including the risks, benefits and alternatives for the proposed anesthesia with the patient or authorized representative who has indicated his/her understanding and acceptance.     Plan Discussed with: CRNA  Anesthesia Plan Comments:         Anesthesia Quick Evaluation

## 2016-04-08 ENCOUNTER — Encounter: Payer: Self-pay | Admitting: Ophthalmology

## 2016-04-30 DIAGNOSIS — N183 Chronic kidney disease, stage 3 (moderate): Secondary | ICD-10-CM | POA: Diagnosis not present

## 2016-04-30 DIAGNOSIS — I10 Essential (primary) hypertension: Secondary | ICD-10-CM | POA: Diagnosis not present

## 2016-04-30 DIAGNOSIS — Z6841 Body Mass Index (BMI) 40.0 and over, adult: Secondary | ICD-10-CM | POA: Diagnosis not present

## 2016-04-30 DIAGNOSIS — E1122 Type 2 diabetes mellitus with diabetic chronic kidney disease: Secondary | ICD-10-CM | POA: Diagnosis not present

## 2016-04-30 DIAGNOSIS — Z79899 Other long term (current) drug therapy: Secondary | ICD-10-CM | POA: Diagnosis not present

## 2016-05-11 DIAGNOSIS — H52209 Unspecified astigmatism, unspecified eye: Secondary | ICD-10-CM | POA: Diagnosis not present

## 2016-05-11 DIAGNOSIS — H5203 Hypermetropia, bilateral: Secondary | ICD-10-CM | POA: Diagnosis not present

## 2016-05-11 DIAGNOSIS — H524 Presbyopia: Secondary | ICD-10-CM | POA: Diagnosis not present

## 2017-07-05 DIAGNOSIS — R1032 Left lower quadrant pain: Secondary | ICD-10-CM | POA: Diagnosis not present

## 2017-07-06 DIAGNOSIS — M1612 Unilateral primary osteoarthritis, left hip: Secondary | ICD-10-CM | POA: Diagnosis not present

## 2017-07-12 DIAGNOSIS — N183 Chronic kidney disease, stage 3 (moderate): Secondary | ICD-10-CM | POA: Diagnosis not present

## 2017-07-12 DIAGNOSIS — R102 Pelvic and perineal pain: Secondary | ICD-10-CM | POA: Diagnosis not present

## 2017-07-12 DIAGNOSIS — M25552 Pain in left hip: Secondary | ICD-10-CM | POA: Diagnosis not present

## 2017-07-12 DIAGNOSIS — Z79899 Other long term (current) drug therapy: Secondary | ICD-10-CM | POA: Diagnosis not present

## 2017-07-12 DIAGNOSIS — E1122 Type 2 diabetes mellitus with diabetic chronic kidney disease: Secondary | ICD-10-CM | POA: Diagnosis not present

## 2017-07-15 ENCOUNTER — Encounter: Payer: Self-pay | Admitting: Emergency Medicine

## 2017-07-15 ENCOUNTER — Emergency Department: Payer: Medicare HMO

## 2017-07-15 ENCOUNTER — Inpatient Hospital Stay
Admission: EM | Admit: 2017-07-15 | Discharge: 2017-07-24 | DRG: 330 | Disposition: A | Discharge status: Skilled Nursing Facility | Payer: Medicare HMO | Attending: Surgery | Admitting: Surgery

## 2017-07-15 ENCOUNTER — Other Ambulatory Visit: Payer: Self-pay

## 2017-07-15 DIAGNOSIS — N189 Chronic kidney disease, unspecified: Secondary | ICD-10-CM

## 2017-07-15 DIAGNOSIS — K651 Peritoneal abscess: Secondary | ICD-10-CM | POA: Diagnosis not present

## 2017-07-15 DIAGNOSIS — K5721 Diverticulitis of large intestine with perforation and abscess with bleeding: Secondary | ICD-10-CM | POA: Diagnosis not present

## 2017-07-15 DIAGNOSIS — E669 Obesity, unspecified: Secondary | ICD-10-CM | POA: Diagnosis present

## 2017-07-15 DIAGNOSIS — Z961 Presence of intraocular lens: Secondary | ICD-10-CM | POA: Diagnosis present

## 2017-07-15 DIAGNOSIS — M1990 Unspecified osteoarthritis, unspecified site: Secondary | ICD-10-CM | POA: Diagnosis present

## 2017-07-15 DIAGNOSIS — E46 Unspecified protein-calorie malnutrition: Secondary | ICD-10-CM | POA: Diagnosis not present

## 2017-07-15 DIAGNOSIS — Z9842 Cataract extraction status, left eye: Secondary | ICD-10-CM | POA: Diagnosis not present

## 2017-07-15 DIAGNOSIS — E876 Hypokalemia: Secondary | ICD-10-CM | POA: Diagnosis not present

## 2017-07-15 DIAGNOSIS — Z932 Ileostomy status: Secondary | ICD-10-CM | POA: Diagnosis not present

## 2017-07-15 DIAGNOSIS — M109 Gout, unspecified: Secondary | ICD-10-CM | POA: Diagnosis present

## 2017-07-15 DIAGNOSIS — N184 Chronic kidney disease, stage 4 (severe): Secondary | ICD-10-CM | POA: Diagnosis present

## 2017-07-15 DIAGNOSIS — I10 Essential (primary) hypertension: Secondary | ICD-10-CM | POA: Diagnosis not present

## 2017-07-15 DIAGNOSIS — E1122 Type 2 diabetes mellitus with diabetic chronic kidney disease: Secondary | ICD-10-CM | POA: Diagnosis not present

## 2017-07-15 DIAGNOSIS — R2681 Unsteadiness on feet: Secondary | ICD-10-CM | POA: Diagnosis not present

## 2017-07-15 DIAGNOSIS — K5720 Diverticulitis of large intestine with perforation and abscess without bleeding: Principal | ICD-10-CM

## 2017-07-15 DIAGNOSIS — I129 Hypertensive chronic kidney disease with stage 1 through stage 4 chronic kidney disease, or unspecified chronic kidney disease: Secondary | ICD-10-CM | POA: Diagnosis not present

## 2017-07-15 DIAGNOSIS — K8020 Calculus of gallbladder without cholecystitis without obstruction: Secondary | ICD-10-CM | POA: Diagnosis not present

## 2017-07-15 DIAGNOSIS — Z8249 Family history of ischemic heart disease and other diseases of the circulatory system: Secondary | ICD-10-CM | POA: Diagnosis not present

## 2017-07-15 DIAGNOSIS — J45909 Unspecified asthma, uncomplicated: Secondary | ICD-10-CM | POA: Diagnosis present

## 2017-07-15 DIAGNOSIS — Z5189 Encounter for other specified aftercare: Secondary | ICD-10-CM | POA: Diagnosis not present

## 2017-07-15 DIAGNOSIS — N179 Acute kidney failure, unspecified: Secondary | ICD-10-CM | POA: Diagnosis not present

## 2017-07-15 DIAGNOSIS — E119 Type 2 diabetes mellitus without complications: Secondary | ICD-10-CM | POA: Diagnosis not present

## 2017-07-15 DIAGNOSIS — K630 Abscess of intestine: Secondary | ICD-10-CM | POA: Diagnosis not present

## 2017-07-15 DIAGNOSIS — Z800 Family history of malignant neoplasm of digestive organs: Secondary | ICD-10-CM | POA: Diagnosis not present

## 2017-07-15 DIAGNOSIS — K632 Fistula of intestine: Secondary | ICD-10-CM | POA: Diagnosis present

## 2017-07-15 DIAGNOSIS — R1032 Left lower quadrant pain: Secondary | ICD-10-CM | POA: Diagnosis not present

## 2017-07-15 DIAGNOSIS — R1902 Left upper quadrant abdominal swelling, mass and lump: Secondary | ICD-10-CM | POA: Diagnosis not present

## 2017-07-15 DIAGNOSIS — E871 Hypo-osmolality and hyponatremia: Secondary | ICD-10-CM | POA: Diagnosis not present

## 2017-07-15 DIAGNOSIS — K567 Ileus, unspecified: Secondary | ICD-10-CM | POA: Diagnosis not present

## 2017-07-15 DIAGNOSIS — Z743 Need for continuous supervision: Secondary | ICD-10-CM | POA: Diagnosis not present

## 2017-07-15 DIAGNOSIS — Z7984 Long term (current) use of oral hypoglycemic drugs: Secondary | ICD-10-CM

## 2017-07-15 DIAGNOSIS — E860 Dehydration: Secondary | ICD-10-CM | POA: Diagnosis present

## 2017-07-15 DIAGNOSIS — Z6835 Body mass index (BMI) 35.0-35.9, adult: Secondary | ICD-10-CM

## 2017-07-15 DIAGNOSIS — Z9841 Cataract extraction status, right eye: Secondary | ICD-10-CM | POA: Diagnosis not present

## 2017-07-15 DIAGNOSIS — M6281 Muscle weakness (generalized): Secondary | ICD-10-CM | POA: Diagnosis not present

## 2017-07-15 DIAGNOSIS — K5780 Diverticulitis of intestine, part unspecified, with perforation and abscess without bleeding: Secondary | ICD-10-CM | POA: Diagnosis not present

## 2017-07-15 DIAGNOSIS — K760 Fatty (change of) liver, not elsewhere classified: Secondary | ICD-10-CM | POA: Diagnosis not present

## 2017-07-15 DIAGNOSIS — K631 Perforation of intestine (nontraumatic): Secondary | ICD-10-CM | POA: Diagnosis not present

## 2017-07-15 LAB — CBC
HCT: 40.9 % (ref 35.0–47.0)
HEMOGLOBIN: 13.4 g/dL (ref 12.0–16.0)
MCH: 28.8 pg (ref 26.0–34.0)
MCHC: 32.7 g/dL (ref 32.0–36.0)
MCV: 87.9 fL (ref 80.0–100.0)
PLATELETS: 430 10*3/uL (ref 150–440)
RBC: 4.65 MIL/uL (ref 3.80–5.20)
RDW: 14.8 % — ABNORMAL HIGH (ref 11.5–14.5)
WBC: 12.8 10*3/uL — ABNORMAL HIGH (ref 3.6–11.0)

## 2017-07-15 LAB — COMPREHENSIVE METABOLIC PANEL
ALK PHOS: 104 U/L (ref 38–126)
ALT: 58 U/L — ABNORMAL HIGH (ref 14–54)
ANION GAP: 9 (ref 5–15)
AST: 64 U/L — ABNORMAL HIGH (ref 15–41)
Albumin: 2.9 g/dL — ABNORMAL LOW (ref 3.5–5.0)
BILIRUBIN TOTAL: 0.9 mg/dL (ref 0.3–1.2)
BUN: 32 mg/dL — ABNORMAL HIGH (ref 6–20)
CALCIUM: 8.7 mg/dL — AB (ref 8.9–10.3)
CO2: 26 mmol/L (ref 22–32)
CREATININE: 1.85 mg/dL — AB (ref 0.44–1.00)
Chloride: 100 mmol/L — ABNORMAL LOW (ref 101–111)
GFR calc Af Amer: 30 mL/min — ABNORMAL LOW (ref >60)
GFR calc non Af Amer: 26 mL/min — ABNORMAL LOW (ref >60)
GLUCOSE: 161 mg/dL — AB (ref 65–99)
Potassium: 3.8 mmol/L (ref 3.5–5.1)
Sodium: 135 mmol/L (ref 135–145)
Total Protein: 8 g/dL (ref 6.5–8.1)

## 2017-07-15 LAB — LIPASE, BLOOD: Lipase: 23 U/L (ref 11–51)

## 2017-07-15 MED ORDER — MORPHINE SULFATE (PF) 2 MG/ML IV SOLN
2.0000 mg | INTRAVENOUS | Status: DC | PRN
  Administered 2017-07-15: 2 mg via INTRAVENOUS
  Filled 2017-07-15: qty 1

## 2017-07-15 MED ORDER — LISINOPRIL 20 MG PO TABS
20.0000 mg | ORAL_TABLET | Freq: Every day | ORAL | Status: DC
  Administered 2017-07-16 – 2017-07-24 (×8): 20 mg via ORAL
  Filled 2017-07-15 (×8): qty 1

## 2017-07-15 MED ORDER — MORPHINE SULFATE (PF) 2 MG/ML IV SOLN
2.0000 mg | INTRAVENOUS | Status: DC | PRN
  Administered 2017-07-16 – 2017-07-20 (×6): 2 mg via INTRAVENOUS
  Filled 2017-07-15 (×6): qty 1

## 2017-07-15 MED ORDER — ACETAMINOPHEN 325 MG PO TABS: 650.0000 mg | ORAL_TABLET | Freq: Four times a day (QID) | ORAL | Status: DC | PRN

## 2017-07-15 MED ORDER — LACTATED RINGERS IV BOLUS
1000.0000 mL | Freq: Once | INTRAVENOUS | Status: AC
  Administered 2017-07-15: 1000 mL via INTRAVENOUS

## 2017-07-15 MED ORDER — ENOXAPARIN SODIUM 40 MG/0.4ML ~~LOC~~ SOLN
40.0000 mg | SUBCUTANEOUS | Status: DC
  Administered 2017-07-15 – 2017-07-17 (×3): 40 mg via SUBCUTANEOUS
  Filled 2017-07-15 (×3): qty 0.4

## 2017-07-15 MED ORDER — DEXTROSE IN LACTATED RINGERS 5 % IV SOLN
INTRAVENOUS | Status: AC
  Administered 2017-07-15 – 2017-07-18 (×7): via INTRAVENOUS

## 2017-07-15 MED ORDER — PIPERACILLIN-TAZOBACTAM 3.375 G IVPB
3.3750 g | Freq: Three times a day (TID) | INTRAVENOUS | Status: DC
  Administered 2017-07-15 – 2017-07-24 (×24): 3.375 g via INTRAVENOUS
  Filled 2017-07-15 (×25): qty 50

## 2017-07-15 MED ORDER — ONDANSETRON 4 MG PO TBDP: 4.0000 mg | ORAL_TABLET | Freq: Four times a day (QID) | ORAL | Status: DC | PRN

## 2017-07-15 MED ORDER — SODIUM CHLORIDE 0.9 % IV BOLUS
1000.0000 mL | Freq: Once | INTRAVENOUS | Status: AC
  Administered 2017-07-15: 1000 mL via INTRAVENOUS

## 2017-07-15 MED ORDER — ACETAMINOPHEN 650 MG RE SUPP: 650.0000 mg | Freq: Four times a day (QID) | RECTAL | Status: DC | PRN

## 2017-07-15 MED ORDER — INSULIN ASPART 100 UNIT/ML ~~LOC~~ SOLN
0.0000 [IU] | SUBCUTANEOUS | Status: DC
  Administered 2017-07-16 (×2): 3 [IU] via SUBCUTANEOUS
  Administered 2017-07-16 (×2): 2 [IU] via SUBCUTANEOUS
  Administered 2017-07-16 (×2): 3 [IU] via SUBCUTANEOUS
  Administered 2017-07-17 (×2): 2 [IU] via SUBCUTANEOUS
  Administered 2017-07-17: 3 [IU] via SUBCUTANEOUS
  Administered 2017-07-17: 2 [IU] via SUBCUTANEOUS
  Administered 2017-07-18: 8 [IU] via SUBCUTANEOUS
  Administered 2017-07-18 (×2): 5 [IU] via SUBCUTANEOUS
  Administered 2017-07-19: 11 [IU] via SUBCUTANEOUS
  Administered 2017-07-19: 5 [IU] via SUBCUTANEOUS
  Administered 2017-07-19: 11 [IU] via SUBCUTANEOUS
  Administered 2017-07-19 (×3): 5 [IU] via SUBCUTANEOUS
  Administered 2017-07-20 – 2017-07-21 (×12): 3 [IU] via SUBCUTANEOUS
  Administered 2017-07-22 (×2): 2 [IU] via SUBCUTANEOUS
  Administered 2017-07-22: 3 [IU] via SUBCUTANEOUS
  Administered 2017-07-22: 2 [IU] via SUBCUTANEOUS
  Administered 2017-07-22 (×3): 3 [IU] via SUBCUTANEOUS
  Administered 2017-07-23 (×3): 2 [IU] via SUBCUTANEOUS
  Administered 2017-07-23: 3 [IU] via SUBCUTANEOUS
  Filled 2017-07-15 (×42): qty 1

## 2017-07-15 MED ORDER — ONDANSETRON HCL 4 MG/2ML IJ SOLN
4.0000 mg | Freq: Four times a day (QID) | INTRAMUSCULAR | Status: DC | PRN
  Filled 2017-07-15: qty 2

## 2017-07-15 MED ORDER — OXYCODONE HCL 5 MG PO TABS
5.0000 mg | ORAL_TABLET | ORAL | Status: DC | PRN
  Administered 2017-07-19 – 2017-07-23 (×11): 10 mg via ORAL
  Filled 2017-07-15 (×12): qty 2

## 2017-07-15 MED ORDER — ONDANSETRON HCL 4 MG/2ML IJ SOLN
4.0000 mg | Freq: Once | INTRAMUSCULAR | Status: AC
  Administered 2017-07-15: 4 mg via INTRAVENOUS
  Filled 2017-07-15: qty 2

## 2017-07-15 MED ORDER — PIPERACILLIN-TAZOBACTAM 3.375 G IVPB 30 MIN
3.3750 g | Freq: Once | INTRAVENOUS | Status: AC
  Administered 2017-07-15: 3.375 g via INTRAVENOUS
  Filled 2017-07-15: qty 50

## 2017-07-15 NOTE — ED Provider Notes (Signed)
[facility] Emergency Department Provider Note  Time seen: 3:39 PM  I have reviewed the triage vital signs and the nursing notes.   HISTORY  Chief Complaint Abdominal Pain    HPI Brenda Gregory is a 77 y.o. female with a past medical history of asthma, arthritis, diabetes, hypertension, presents to the emergency department for left lower quadrant abdominal pain.  According to the patient for the past 1.5 weeks she has been experiencing left lower quadrant abdominal pain, dull aching sensation, relieved somewhat by lying flat, worsening with any type of movement especially sitting up or ambulation.  States she has lost weight due to decreased appetite over the past 10 days as well.  Not drinking or eating much.  Patient does not really know why, states she is able to do so but just does not have the appetite to do so.  Denies any fever, cough, congestion, chest pain, vomiting, nausea, diarrhea dysuria or hematuria.  Largely negative review of systems.   Past Medical History:  Diagnosis Date  . Arthritis    knees, right foot  . Asthma    as child  . Diabetes mellitus without complication ([place])   . Gout   . Hypertension     There are no active problems to display for this patient.   Past Surgical History:  Procedure Laterality Date  . ABDOMINAL HYSTERECTOMY    . CATARACT EXTRACTION W/PHACO Left 02/25/2016   Procedure: CATARACT EXTRACTION PHACO AND INTRAOCULAR LENS PLACEMENT (IOC);  Surgeon: Eulogio Bear, MD;  Location: [place];  Service: Ophthalmology;  Laterality: Left;  DIABETIC - oral meds LEFT  . CATARACT EXTRACTION W/PHACO Right 04/07/2016   Procedure: CATARACT EXTRACTION PHACO AND INTRAOCULAR LENS PLACEMENT (IOC);  Surgeon: Eulogio Bear, MD;  Location: [place];  Service: Ophthalmology;  Laterality: Right;  RIGHT DIABETES - oral meds IVA TOPICAL  . KNEE SURGERY Left     Prior to Admission medications   Medication  Sig Start Date End Date Taking? Authorizing Provider  acetaminophen (TYLENOL) 650 MG CR tablet Take 650 mg by mouth as needed for pain.    [provider]  allopurinol (ZYLOPRIM) 300 MG tablet Take 300 mg by mouth daily.    [provider]  lisinopril (PRINIVIL,ZESTRIL) 20 MG tablet Take 20 mg by mouth daily.    [provider]  metFORMIN (GLUCOPHAGE) 500 MG tablet Take 500 mg by mouth daily with breakfast.    [provider]    No Known Allergies  History reviewed. No pertinent family history.  Social History Social History   Tobacco Use  . Smoking status: Former Smoker    Last attempt to quit: 03/30/1978    Years since quitting: 39.3  . Smokeless tobacco: Never Used  Substance Use Topics  . Alcohol use: No  . Drug use: Not on file    Review of Systems Constitutional: Negative for fever. Eyes: Negative for visual complaints ENT: Negative for recent illness/congestion Cardiovascular: Negative for chest pain. Respiratory: Negative for shortness of breath. Gastrointestinal: Dull left lower quadrant abdominal pain, moderate.  Negative for nausea vomiting diarrhea Genitourinary: Negative for urinary compaints Musculoskeletal: Negative for musculoskeletal complaints Skin: Negative for skin complaints  Neurological: Negative for headache All other ROS negative  ____________________________________________   PHYSICAL EXAM:  VITAL SIGNS: ED Triage Vitals  Enc Vitals Group     BP 07/15/17 1244 121/63     Pulse Rate 07/15/17 1244 97     Resp 07/15/17  1244 18     Temp 07/15/17 1244 98.8 F (37.1 C)     Temp src --      SpO2 07/15/17 1244 99 %     Weight 07/15/17 1244 200 lb (90.7 kg)     Height 07/15/17 1244 5\' 5"  (1.651 m)     Head Circumference --      Peak Flow --      Pain Score 07/15/17 1251 10     Pain Loc --      Pain Edu? --      Excl. in [place]? --    Constitutional: Alert and oriented. Well appearing and in no distress. Eyes:  Normal exam ENT   Head: Normocephalic and atraumatic.   Mouth/Throat: Mucous membranes are moist. Cardiovascular: Normal rate, regular rhythm. No murmur Respiratory: Normal respiratory effort without tachypnea nor retractions. Breath sounds are clear Gastrointestinal: Left lower quadrant abdominal tenderness, mild, fullness palpated in left lower quadrant.  No rebound or guarding.  No distention.  Abdomen otherwise benign. Musculoskeletal: Nontender with normal range of motion in all extremities.  Neurologic:  Normal speech and language. No gross focal neurologic deficits  Skin:  Skin is warm, dry and intact.  Psychiatric: Mood and affect are normal.   ____________________________________________   RADIOLOGY  CT shows large abscess to the left pelvis connected to the sigmoid colon  ____________________________________________   INITIAL IMPRESSION / ASSESSMENT AND PLAN / ED COURSE  Pertinent labs & imaging results that were available during my care of the patient were reviewed by me and considered in my medical decision making (see chart for details).  Patient presents to the emergency department for left lower quadrant pain times 1.5 weeks.  Differential is quite broad but would include colitis, diverticulitis, mass/tumor, cystitis, urinary tract infection, pyelonephritis, ureterolithiasis.  Patient is status post hysterectomy.  Patient's labs show mildly elevated LFTs patient has no right upper quadrant tenderness.  Mildly elevated creatinine over baseline likely due to recent dehydration.  Mild leukocytosis.  We will proceed with a CT the abdomen/pelvis to further evaluate.  Patient agreeable to this plan of care.  We will IV hydrate and continue to closely monitor.  CT appears to show a perforated sigmoid colon with 9 x 12 abscess.  Likely the cause of the patient's discomfort.  Possibly related to diverticulitis with perforation.  We will place the patient on IV Zosyn.  I  discussed the patient with Dr. [name] of general surgery who will be down to see the patient.  Patient will require admission to the hospital and likely drainage by interventional radiology.  ____________________________________________   FINAL CLINICAL IMPRESSION(S) / ED DIAGNOSES  Left lower quadrant abdominal pain Colonic perforation Intra-abdominal abscess   Harvest Dark, MD 07/15/17 [phone]

## 2017-07-15 NOTE — Consult Note (Signed)
[facility] [place] NAME: Brenda Gregory    MR#:  [id number]  DATE OF BIRTH:  November 06, 1940  DATE OF ADMISSION:  07/15/2017  PRIMARY CARE PHYSICIAN: Derinda Late, MD   REQUESTING/REFERRING PHYSICIAN: [name], MD  CHIEF COMPLAINT:   Chief Complaint  Patient presents with  . Abdominal Pain    HISTORY OF PRESENT ILLNESS:  Brenda Gregory  is a 77 y.o. female who presents with pain, decreased appetite, weight loss.  Patient found here on imaging to have pericolonic abscess.  Surgical service admitted the patient with antibiotics, and hospitalist were called to assist in managing comorbid conditions  PAST MEDICAL HISTORY:   Past Medical History:  Diagnosis Date  . Arthritis    knees, right foot  . Asthma    as child  . Diabetes mellitus without complication ([place])   . Gout   . Hypertension     PAST SURGICAL HISTOIRY:   Past Surgical History:  Procedure Laterality Date  . ABDOMINAL HYSTERECTOMY    . CATARACT EXTRACTION W/PHACO Left 02/25/2016   Procedure: CATARACT EXTRACTION PHACO AND INTRAOCULAR LENS PLACEMENT (IOC);  Surgeon: Eulogio Bear, MD;  Location: [place] [facility];  Service: Ophthalmology;  Laterality: Left;  DIABETIC - oral meds LEFT  . CATARACT EXTRACTION W/PHACO Right 04/07/2016   Procedure: CATARACT EXTRACTION PHACO AND INTRAOCULAR LENS PLACEMENT (IOC);  Surgeon: Eulogio Bear, MD;  Location: [place];  Service: Ophthalmology;  Laterality: Right;  RIGHT DIABETES - oral meds IVA TOPICAL  . KNEE SURGERY Left     SOCIAL HISTORY:   Social History   Tobacco Use  . Smoking status: Former Smoker    Last attempt to quit: 03/30/1978    Years since quitting: 39.3  . Smokeless tobacco: Never Used  Substance Use Topics  . Alcohol use: No    FAMILY HISTORY:   Family History  Problem Relation Age of Onset  . Dementia Mother   . Heart attack Father   . Colon cancer Brother     DRUG  ALLERGIES:  No Known Allergies  REVIEW OF SYSTEMS:  Review of Systems  Constitutional: Negative for chills, fever, malaise/fatigue and weight loss.  HENT: Negative for ear pain, hearing loss and tinnitus.   Eyes: Negative for blurred vision, double vision, pain and redness.  Respiratory: Negative for cough, hemoptysis and shortness of breath.   Cardiovascular: Negative for chest pain, palpitations, orthopnea and leg swelling.  Gastrointestinal: Positive for abdominal pain. Negative for constipation, diarrhea, nausea and vomiting.  Genitourinary: Negative for dysuria, frequency and hematuria.  Musculoskeletal: Negative for back pain, joint pain and neck pain.  Skin:       No acne, rash, or lesions  Neurological: Negative for dizziness, tremors, focal weakness and weakness.  Endo/Heme/Allergies: Negative for polydipsia. Does not bruise/bleed easily.  Psychiatric/Behavioral: Negative for depression. The patient is not nervous/anxious and does not have insomnia.     MEDICATIONS AT HOME:   Prior to Admission medications   Medication Sig Start Date End Date Taking? Authorizing Provider  acetaminophen (TYLENOL) 650 MG CR tablet Take 650 mg by mouth as needed for pain.   Yes [provider]  allopurinol (ZYLOPRIM) 300 MG tablet Take 300 mg by mouth daily.   Yes [provider]  ciprofloxacin (CIPRO) 500 MG tablet Take 500 mg by mouth 2 (two) times daily. 07/14/17  Yes [provider]  lisinopril (PRINIVIL,ZESTRIL) 20 MG tablet Take 20 mg by mouth daily.  Yes [provider]  metFORMIN (GLUCOPHAGE) 500 MG tablet Take 500 mg by mouth daily with breakfast.   Yes [provider]  metroNIDAZOLE (FLAGYL) 500 MG tablet Take 500 mg by mouth 2 (two) times daily. 07/14/17 07/24/17 Yes [provider]      VITAL SIGNS:   Vitals:   07/15/17 1730 07/15/17 1800 07/15/17 1815 07/15/17 2000  BP: (!) 117/59 127/66 127/66 128/69  Pulse: 81 84 82 89   Resp:   16 18  Temp:    98.7 F (37.1 C)  TempSrc:    Oral  SpO2: 94% 95% 97% 97%  Weight:    91 kg (200 lb 9.6 oz)  Height:    5\' 5"  (1.651 m)   Wt Readings from Last 3 Encounters:  07/15/17 91 kg (200 lb 9.6 oz)  04/07/16 106.1 kg (234 lb)  02/25/16 106.6 kg (235 lb)    PHYSICAL EXAMINATION:  Physical Exam  Vitals reviewed. Constitutional: She is oriented to person, place, and time. She appears well-developed and well-nourished. No distress.  HENT:  Head: Normocephalic and atraumatic.  Mouth/Throat: Oropharynx is clear and moist.  Eyes: Pupils are equal, round, and reactive to light. Conjunctivae and EOM are normal. No scleral icterus.  Neck: Normal range of motion. Neck supple. No JVD present. No thyromegaly present.  Cardiovascular: Normal rate, regular rhythm and intact distal pulses. Exam reveals no gallop and no friction rub.  No murmur heard. Respiratory: Effort normal and breath sounds normal. No respiratory distress. She has no wheezes. She has no rales.  GI: Soft. Bowel sounds are normal. She exhibits no distension. There is tenderness.  Musculoskeletal: Normal range of motion. She exhibits no edema.  No arthritis, no gout  Lymphadenopathy:    She has no cervical adenopathy.  Neurological: She is alert and oriented to person, place, and time. No cranial nerve deficit.  No dysarthria, no aphasia  Skin: Skin is warm and dry. No rash noted. No erythema.  Psychiatric: She has a normal mood and affect. Her behavior is normal. Judgment and thought content normal.     LABORATORY PANEL:   CBC Recent Labs  Lab 07/15/17 1252  WBC 12.8*  HGB 13.4  HCT 40.9  PLT 430   ------------------------------------------------------------------------------------------------------------------  Chemistries  Recent Labs  Lab 07/15/17 1252  NA 135  K 3.8  CL 100*  CO2 26  GLUCOSE 161*  BUN 32*  CREATININE 1.85*  CALCIUM 8.7*  AST 64*  ALT 58*  ALKPHOS 104  BILITOT  0.9   ------------------------------------------------------------------------------------------------------------------  Cardiac Enzymes No results for input(s): TROPONINI in the last 168 hours. ------------------------------------------------------------------------------------------------------------------  RADIOLOGY:  Ct Abdomen Pelvis Wo Contrast  Result Date: 07/15/2017 CLINICAL DATA:  77 year old female with acute LEFT abdominal and pelvic pain for 10 days. EXAM: CT ABDOMEN AND PELVIS WITHOUT CONTRAST TECHNIQUE: Multidetector CT imaging of the abdomen and pelvis was performed following the standard protocol without IV contrast. COMPARISON:  None. FINDINGS: Please note that parenchymal abnormalities may be missed without intravenous contrast. Lower chest: No acute abnormality Hepatobiliary: Mild hepatic steatosis noted without focal hepatic abnormality. Cholelithiasis identified without CT evidence of acute cholecystitis. No biliary dilatation. Pancreas: Atrophic without other significant abnormality. Spleen: Unremarkable Adrenals/Urinary Tract: Bilateral renal atrophy identified. No hydronephrosis, renal calculi or adrenal abnormality. The bladder is unremarkable. Stomach/Bowel: A 9.1 x 6.5 x 12.5 cm collection with gas along the low LEFT pelvic wall is compatible with an abscess. There appears to be a connection of this abscess to the  mid sigmoid colon. There is no evidence of bowel obstruction. Colonic diverticulosis noted. The appendix is normal. Vascular/Lymphatic: Aortic atherosclerosis. No enlarged abdominal or pelvic lymph nodes. Reproductive: A 3.8 cm RIGHT ovarian cyst is present. The LEFT ovary is unremarkable. Patient is status post hysterectomy. Other: No ascites or pneumoperitoneum. Musculoskeletal: No acute or suspicious bony abnormalities. Degenerative changes within the lumbar spine are noted. IMPRESSION: 1. 9.1 x 6.5 x 12.5 cm abscess along the low LEFT pelvic wall with apparent  connection to the mid sigmoid colon which is likely the source. 2. 3.8 cm RIGHT ovarian cyst. Pelvic ultrasound evaluation recommended. 3. Hepatic steatosis. 4. Cholelithiasis. 5.  Aortic Atherosclerosis (ICD10-I70.0). Electronically Signed   By: Margarette Canada M.D.   On: 07/15/2017 16:20    EKG:   Orders placed or performed during the hospital encounter of 01/18/15  . ED EKG within 10 minutes  . ED EKG within 10 minutes  . EKG    IMPRESSION AND PLAN:  Principal Problem:   Abscess of sigmoid colon -on antibiotics, surgical service treating, defer to their recommendations Active Problems:   Acute renal failure superimposed on chronic kidney disease stage III - IV fluids, avoid nephrotoxins and monitor for improvement   Diabetes (HCC) -sliding scale insulin with corresponding glucose checks   HTN (hypertension) -continue home medications   Gout - will hold allopurinol for now given declined GFR, can restart when this improves  All the records are reviewed and case discussed with ED provider. Management plans discussed with the patient and/or family.  CODE STATUS: Full    Code Status Orders  (From admission, onward)        Start     Ordered   07/15/17 1812  Full code  Continuous     07/15/17 1817    Code Status History    This patient has a current code status but no historical code status.    Full Code  TOTAL TIME TAKING CARE OF THIS PATIENT: 40 minutes.    Halana Deisher [place] 07/15/2017, 8:39 PM  [facility]  Office  [phone]  CC: Primary care Physician: Derinda Late, MD  Note:  This document was prepared using Dragon voice recognition software and may include unintentional dictation errors.

## 2017-07-15 NOTE — ED Notes (Signed)
Spoke with pt about wait times and what to expect next. Advised pt that I am available for further questions if needed.  

## 2017-07-15 NOTE — ED Notes (Signed)
Pt diagnosed w/ diverticulitis yesterday and has begun abx that were rx'd, Cipro and flagyl. Pt has had 2 doses. Pt has poor PO intake and is not checking her CBG at home as is recommended.

## 2017-07-15 NOTE — ED Triage Notes (Signed)
Pt reports that she is having LLQ pain, that has been going on for a week and a half, has been unable to eat or drink for about that long. Denies N/V/D. She started taking antibiotics yesterday because her PMD thinks that she has diverticulitis. Pts mucous membranes are pink and moist.

## 2017-07-15 NOTE — ED Notes (Signed)
Patient transported to 215

## 2017-07-15 NOTE — H&P (Signed)
SURGICAL ADMISSION HISTORY & PHYSICAL (cpt: [phone])  HISTORY OF PRESENT ILLNESS (HPI):  77 y.o. female presented to [facility] ED today for evaluation of abdominal pain. Patient reports her appetite has been poor for 2 months, and she has lost 15 lbs over the past 1.5 weeks without trying to loose weight. She reports subjective intermittent chills, but has not checked her temperature. Though her brother died from colon cancer at 95 years old, she has never had a colonoscopy performed. She denies black or bloody BM's, denies N/V, constipation, or diarrhea with +flatus and states her primary medical physician started her on outpatient oral antibiotics a few days ago due to concern for diverticulitis as per patient (though CT not done until today). She otherwise denies CP or SOB.  Surgery is consulted by ED physician Dr. Kerman Passey in this context for evaluation and management of sigmoid peri-colonic abscess.  PAST MEDICAL HISTORY (PMH):  Past Medical History:  Diagnosis Date  . Arthritis    knees, right foot  . Asthma    as child  . Diabetes mellitus without complication ([place])   . Gout   . Hypertension      PAST SURGICAL HISTORY ([place]):  Past Surgical History:  Procedure Laterality Date  . ABDOMINAL HYSTERECTOMY    . CATARACT EXTRACTION W/PHACO Left 02/25/2016   Procedure: CATARACT EXTRACTION PHACO AND INTRAOCULAR LENS PLACEMENT (IOC);  Surgeon: Eulogio Bear, MD;  Location: [place];  Service: Ophthalmology;  Laterality: Left;  DIABETIC - oral meds LEFT  . CATARACT EXTRACTION W/PHACO Right 04/07/2016   Procedure: CATARACT EXTRACTION PHACO AND INTRAOCULAR LENS PLACEMENT (IOC);  Surgeon: Eulogio Bear, MD;  Location: [place];  Service: Ophthalmology;  Laterality: Right;  RIGHT DIABETES - oral meds IVA TOPICAL  . KNEE SURGERY Left      MEDICATIONS:  Prior to Admission medications   Medication Sig Start Date End Date Taking? Authorizing Provider  acetaminophen  (TYLENOL) 650 MG CR tablet Take 650 mg by mouth as needed for pain.    [provider]  allopurinol (ZYLOPRIM) 300 MG tablet Take 300 mg by mouth daily.    [provider]  lisinopril (PRINIVIL,ZESTRIL) 20 MG tablet Take 20 mg by mouth daily.    [provider]  metFORMIN (GLUCOPHAGE) 500 MG tablet Take 500 mg by mouth daily with breakfast.    [provider]     ALLERGIES:  No Known Allergies   SOCIAL HISTORY:  Social History   Socioeconomic History  . Marital status: Divorced    Spouse name: Not on file  . Number of children: Not on file  . Years of education: Not on file  . Highest education level: Not on file  Occupational History  . Not on file  Social Needs  . Financial resource strain: Not on file  . Food insecurity:    Worry: Not on file    Inability: Not on file  . Transportation needs:    Medical: Not on file    Non-medical: Not on file  Tobacco Use  . Smoking status: Former Smoker    Last attempt to quit: 03/30/1978    Years since quitting: 39.3  . Smokeless tobacco: Never Used  Substance and Sexual Activity  . Alcohol use: No  . Drug use: Not on file  . Sexual activity: Not on file  Lifestyle  . Physical activity:    Days per week: Not on file    Minutes per session: Not on file  .  Stress: Not on file  Relationships  . Social connections:    Talks on phone: Not on file    Gets together: Not on file    Attends religious service: Not on file    Active member of club or organization: Not on file    Attends meetings of clubs or organizations: Not on file    Relationship status: Not on file  . Intimate partner violence:    Fear of current or ex partner: Not on file    Emotionally abused: Not on file    Physically abused: Not on file    Forced sexual activity: Not on file  Other Topics Concern  . Not on file  Social History Narrative  . Not on file    The patient currently resides (home / rehab facility / nursing  home): Home The patient normally is (ambulatory / bedbound): Ambulatory   FAMILY HISTORY:  History reviewed. No pertinent family history.   REVIEW OF SYSTEMS:  Constitutional: denies weight loss, fever, chills, or sweats  Eyes: denies any other vision changes, history of eye injury  ENT: denies sore throat, hearing problems  Respiratory: denies shortness of breath, wheezing  Cardiovascular: denies chest pain, palpitations  Gastrointestinal: abdominal pain, N/V, and bowel function as per HPI Genitourinary: denies burning with urination or urinary frequency Musculoskeletal: denies any other joint pains or cramps  Skin: denies any other rashes or skin discolorations  Neurological: denies any other headache, dizziness, weakness  Psychiatric: denies any other depression, anxiety   All other review of systems were negative   VITAL SIGNS:  Temp:  [98.8 F (37.1 C)] 98.8 F (37.1 C) (04/18 1244) Pulse Rate:  [93-97] 93 (04/18 1517) Resp:  [18-20] 20 (04/18 1517) BP: (113-121)/(58-63) 113/58 (04/18 1517) SpO2:  [97 %-99 %] 97 % (04/18 1517) Weight:  [200 lb (90.7 kg)] 200 lb (90.7 kg) (04/18 1244)     Height: 5\' 5"  (165.1 cm) Weight: 200 lb (90.7 kg) BMI (Calculated): 33.28   INTAKE/OUTPUT:  This shift: No intake/output data recorded.  Last 2 shifts: @IOLAST2SHIFTS @   PHYSICAL EXAM:  Constitutional:  -- Obese body habitus  -- Awake, alert, and oriented x3, no apparent distress Eyes:  -- Pupils equally round and reactive to light  -- No scleral icterus, B/L no occular discharge Ear, nose, throat: -- Neck is FROM WNL -- No jugular venous distension  Pulmonary:  -- No wheezes or rhales -- Equal breath sounds bilaterally -- Breathing non-labored at rest Cardiovascular:  -- S1, S2 present  -- No pericardial rubs  Gastrointestinal:  -- Abdomen soft and obese, but not obviously distended, with focal LLQ abdominal tenderness to palpation, no guarding or rebound tenderness -- No  abdominal masses appreciated, pulsatile or otherwise  Musculoskeletal and Integumentary:  -- Wounds or skin discoloration: None appreciated (specifically no LLQ abdominal wall cellulitis) -- Extremities: B/L UE and LE FROM, hands and feet warm  Neurologic:  -- Motor function: Intact and symmetric -- Sensation: Intact and symmetric Psychiatric:  -- Mood and affect WNL  Labs:  CBC Latest Ref Rng & Units 07/15/2017 01/18/2015  WBC 3.6 - 11.0 K/uL 12.8(H) 12.8(H)  Hemoglobin 12.0 - 16.0 g/dL 13.4 12.6  Hematocrit 35.0 - 47.0 % 40.9 38.5  Platelets 150 - 440 K/uL 430 256   CMP Latest Ref Rng & Units 07/15/2017 01/18/2015  Glucose 65 - 99 mg/dL 161(H) 186(H)  BUN 6 - 20 mg/dL 32(H) 24(H)  Creatinine 0.44 - 1.00 mg/dL 1.85(H) 1.29(H)  Sodium  135 - 145 mmol/L 135 133(L)  Potassium 3.5 - 5.1 mmol/L 3.8 3.9  Chloride 101 - 111 mmol/L 100(L) 101  CO2 22 - 32 mmol/L 26 23  Calcium 8.9 - 10.3 mg/dL 8.7(L) 9.3  Total Protein 6.5 - 8.1 g/dL 8.0 -  Total Bilirubin 0.3 - 1.2 mg/dL 0.9 -  Alkaline Phos 38 - 126 U/L 104 -  AST 15 - 41 U/L 64(H) -  ALT 14 - 54 U/L 58(H) -   Imaging studies:  CT Abdomen and Pelvis without Contrast (07/16/2017) - personally reviewed and discussed with patient 1. 9.1 x 6.5 x 12.5 cm abscess along the low LEFT pelvic wall with apparent connection to the mid sigmoid colon which is likely the source. 2. 3.8 cm RIGHT ovarian cyst. Pelvic ultrasound evaluation recommended. 3. Hepatic steatosis. 4. Cholelithiasis. 5.  Aortic Atherosclerosis  Assessment/Plan: (ICD-10's: K41.20) 77 y.o. female with a 9.1 x 6.5 x 12.5 cm peri-colonic abscess into abdominal wall adjacent to sigmoid colon, concerning for eroding colon cancer vs diverticulitis, complicated by AKI and relatively mild leukocytosis and by pertinent comorbidities including obesity (BMI >33), DM, HTN, gout, osteoarthritis, and former tobacco abuse (smoking).               - NPO for now, IV fluids +bolus              - pain control prn, minimize narcotics              - IV antibiotics (agree with Zosyn ordered by ED, will continue)  - check CEA and request GI consultation for possible sigmoidoscopy vs colonoscopy             - will admit to surgical service and continue to monitor abdominal exam and bowel function  - medical consultation for medical management of comorbidities             - DVT prophylaxis, ambulation encouraged  All of the above findings and recommendations were discussed with the patient and her family, and all of patient's and her family's questions were answered to their expressed satisfaction.  Thank you for the opportunity to participate in this patient's care.   -- Marilynne Drivers [name], MD, [place]: [facility] - Partnering for exceptional care. Office: [phone]

## 2017-07-16 ENCOUNTER — Inpatient Hospital Stay: Payer: Medicare HMO

## 2017-07-16 ENCOUNTER — Encounter: Payer: Self-pay | Admitting: Radiology

## 2017-07-16 DIAGNOSIS — K651 Peritoneal abscess: Secondary | ICD-10-CM

## 2017-07-16 LAB — BASIC METABOLIC PANEL
ANION GAP: 7 (ref 5–15)
BUN: 31 mg/dL — ABNORMAL HIGH (ref 6–20)
CHLORIDE: 105 mmol/L (ref 101–111)
CO2: 25 mmol/L (ref 22–32)
Calcium: 8.2 mg/dL — ABNORMAL LOW (ref 8.9–10.3)
Creatinine, Ser: 1.64 mg/dL — ABNORMAL HIGH (ref 0.44–1.00)
GFR calc non Af Amer: 30 mL/min — ABNORMAL LOW (ref >60)
GFR, EST AFRICAN AMERICAN: 34 mL/min — AB (ref >60)
Glucose, Bld: 186 mg/dL — ABNORMAL HIGH (ref 65–99)
POTASSIUM: 4 mmol/L (ref 3.5–5.1)
SODIUM: 137 mmol/L (ref 135–145)

## 2017-07-16 LAB — GLUCOSE, CAPILLARY
GLUCOSE-CAPILLARY: 177 mg/dL — AB (ref 65–99)
GLUCOSE-CAPILLARY: 173 mg/dL — AB (ref 65–99)
GLUCOSE-CAPILLARY: 141 mg/dL — AB (ref 65–99)
GLUCOSE-CAPILLARY: 188 mg/dL — AB (ref 65–99)
Glucose-Capillary: 131 mg/dL — ABNORMAL HIGH (ref 65–99)
Glucose-Capillary: 163 mg/dL — ABNORMAL HIGH (ref 65–99)
Glucose-Capillary: 126 mg/dL — ABNORMAL HIGH (ref 65–99)
Glucose-Capillary: 142 mg/dL — ABNORMAL HIGH (ref 65–99)

## 2017-07-16 LAB — CBC
HCT: 34.2 % — ABNORMAL LOW (ref 35.0–47.0)
HEMOGLOBIN: 11.6 g/dL — AB (ref 12.0–16.0)
MCH: 29.7 pg (ref 26.0–34.0)
MCHC: 33.8 g/dL (ref 32.0–36.0)
MCV: 87.8 fL (ref 80.0–100.0)
PLATELETS: 342 10*3/uL (ref 150–440)
RBC: 3.89 MIL/uL (ref 3.80–5.20)
RDW: 14.6 % — ABNORMAL HIGH (ref 11.5–14.5)
WBC: 10.5 10*3/uL (ref 3.6–11.0)

## 2017-07-16 LAB — PROTIME-INR
INR: 1.39
Prothrombin Time: 16.9 seconds — ABNORMAL HIGH (ref 11.4–15.2)

## 2017-07-16 MED ORDER — MIDAZOLAM HCL 5 MG/5ML IJ SOLN
INTRAMUSCULAR | Status: AC | PRN
  Administered 2017-07-16: 0.5 mg via INTRAVENOUS

## 2017-07-16 MED ORDER — SODIUM CHLORIDE 0.9% FLUSH
5.0000 mL | Freq: Three times a day (TID) | INTRAVENOUS | Status: DC
  Administered 2017-07-16 – 2017-07-17 (×2): 5 mL
  Administered 2017-07-17: 3 mL
  Administered 2017-07-17 – 2017-07-24 (×11): 5 mL

## 2017-07-16 MED ORDER — FENTANYL CITRATE (PF) 100 MCG/2ML IJ SOLN
INTRAMUSCULAR | Status: AC | PRN
  Administered 2017-07-16: 50 ug via INTRAVENOUS

## 2017-07-16 MED ORDER — ORAL CARE MOUTH RINSE
15.0000 mL | Freq: Two times a day (BID) | OROMUCOSAL | Status: DC
  Administered 2017-07-16 – 2017-07-23 (×13): 15 mL via OROMUCOSAL

## 2017-07-16 MED ORDER — CHLORHEXIDINE GLUCONATE 0.12 % MT SOLN
15.0000 mL | Freq: Two times a day (BID) | OROMUCOSAL | Status: DC
  Administered 2017-07-16 – 2017-07-24 (×14): 15 mL via OROMUCOSAL
  Filled 2017-07-16 (×14): qty 15

## 2017-07-16 MED ORDER — MIDAZOLAM HCL 5 MG/5ML IJ SOLN
INTRAMUSCULAR | Status: AC
  Filled 2017-07-16: qty 5

## 2017-07-16 MED ORDER — FENTANYL CITRATE (PF) 100 MCG/2ML IJ SOLN
INTRAMUSCULAR | Status: AC
  Filled 2017-07-16: qty 4

## 2017-07-16 MED ORDER — LIDOCAINE HCL (PF) 1 % IJ SOLN
INTRAMUSCULAR | Status: AC | PRN
  Administered 2017-07-16: 10 mL

## 2017-07-16 NOTE — Progress Notes (Signed)
Chief Complaint: Patient was seen in consultation today for abdominal abscess at the request of Dr. Tama High  Referring Physician(s): Dr. Tama High  Supervising Physician: Daryll Brod  Patient Status: [facility] - In-pt  History of Present Illness: Brenda Gregory is a 77 y.o. female admitted with abdominal pain. Her workup finds evidence of large LLQ/pelvic wall abscess adjacent to the colon, likely diverticular in origin. IV abx have been started and IR is asked to place perc drain. Chart, meds, labs, imaging, allergies reviewed. Pt feeling okay at the moment. Daughter at bedside.  Past Medical History:  Diagnosis Date  . Arthritis    knees, right foot  . Asthma    as child  . Diabetes mellitus without complication ([place])   . Gout   . Hypertension     Past Surgical History:  Procedure Laterality Date  . ABDOMINAL HYSTERECTOMY    . CATARACT EXTRACTION W/PHACO Left 02/25/2016   Procedure: CATARACT EXTRACTION PHACO AND INTRAOCULAR LENS PLACEMENT (IOC);  Surgeon: Eulogio Bear, MD;  Location: [place];  Service: Ophthalmology;  Laterality: Left;  DIABETIC - oral meds LEFT  . CATARACT EXTRACTION W/PHACO Right 04/07/2016   Procedure: CATARACT EXTRACTION PHACO AND INTRAOCULAR LENS PLACEMENT (IOC);  Surgeon: Eulogio Bear, MD;  Location: [place];  Service: Ophthalmology;  Laterality: Right;  RIGHT DIABETES - oral meds IVA TOPICAL  . KNEE SURGERY Left     Allergies: Patient has no known allergies.  Medications:  Current Facility-Administered Medications:  .  acetaminophen (TYLENOL) tablet 650 mg, 650 mg, Oral, Q6H PRN **OR** acetaminophen (TYLENOL) suppository 650 mg, 650 mg, Rectal, Q6H PRN, Vickie Epley, MD .  chlorhexidine (PERIDEX) 0.12 % solution 15 mL, 15 mL, Mouth Rinse, BID, Vickie Epley, MD .  dextrose 5 % in lactated ringers infusion, , Intravenous, Continuous, Vickie Epley, MD, Last Rate: 125 mL/hr at 07/16/17  8416 .  enoxaparin (LOVENOX) injection 40 mg, 40 mg, Subcutaneous, Q24H, Vickie Epley, MD, 40 mg at 07/15/17 2234 .  insulin aspart (novoLOG) injection 0-15 Units, 0-15 Units, Subcutaneous, Q4H, Lance Coon, MD, 3 Units at 07/16/17 0801 .  lisinopril (PRINIVIL,ZESTRIL) tablet 20 mg, 20 mg, Oral, Daily, [name] Arn Medal, MD .  MEDLINE mouth rinse, 15 mL, Mouth Rinse, q12n4p, Vickie Epley, MD .  morphine 2 MG/ML injection 2 mg, 2 mg, Intravenous, Q3H PRN, Vickie Epley, MD, 2 mg at 07/16/17 0749 .  ondansetron (ZOFRAN-ODT) disintegrating tablet 4 mg, 4 mg, Oral, Q6H PRN **OR** ondansetron (ZOFRAN) injection 4 mg, 4 mg, Intravenous, Q6H PRN, Vickie Epley, MD .  oxyCODONE (Oxy IR/ROXICODONE) immediate release tablet 5-10 mg, 5-10 mg, Oral, Q4H PRN, Vickie Epley, MD .  piperacillin-tazobactam (ZOSYN) IVPB 3.375 g, 3.375 g, Intravenous, Q8H, Vickie Epley, MD, Stopped at 07/16/17 1030    Family History  Problem Relation Age of Onset  . Dementia Mother   . Heart attack Father   . Colon cancer Brother     Social History   Socioeconomic History  . Marital status: Divorced    Spouse name: Not on file  . Number of children: Not on file  . Years of education: Not on file  . Highest education level: Not on file  Occupational History  . Not on file  Social Needs  . Financial resource strain: Not on file  . Food insecurity:    Worry: Not on file    Inability: Not on file  . Transportation  needs:    Medical: Not on file    Non-medical: Not on file  Tobacco Use  . Smoking status: Former Smoker    Last attempt to quit: 03/30/1978    Years since quitting: 39.3  . Smokeless tobacco: Never Used  Substance and Sexual Activity  . Alcohol use: No  . Drug use: Not on file  . Sexual activity: Not on file  Lifestyle  . Physical activity:    Days per week: Not on file    Minutes per session: Not on file  . Stress: Not on file  Relationships  . Social connections:      Talks on phone: Not on file    Gets together: Not on file    Attends religious service: Not on file    Active member of club or organization: Not on file    Attends meetings of clubs or organizations: Not on file    Relationship status: Not on file  Other Topics Concern  . Not on file  Social History Narrative  . Not on file    Review of Systems: A 12 point ROS discussed and pertinent positives are indicated in the HPI above.  All other systems are negative.  Review of Systems  Vital Signs: BP 128/70 (BP Location: Left Arm)   Pulse 77   Temp 98.1 F (36.7 C) (Oral)   Resp 16   Ht 5\' 5"  (1.651 m)   Wt 200 lb 9.6 oz (91 kg)   SpO2 99%   BMI 33.38 kg/m   Physical Exam  Constitutional: She is oriented to person, place, and time. She appears well-developed. No distress.  HENT:  Head: Normocephalic.  Mouth/Throat: Oropharynx is clear and moist.  Neck: Normal range of motion. No tracheal deviation present.  Cardiovascular: Normal rate, regular rhythm and normal heart sounds.  Pulmonary/Chest: Effort normal and breath sounds normal. No respiratory distress.  Abdominal: Soft. She exhibits no mass. There is tenderness. There is no guarding.  LLQ tenderness  Neurological: She is alert and oriented to person, place, and time.  Skin: Skin is warm and dry.    Imaging: Ct Abdomen Pelvis Wo Contrast  Result Date: 07/15/2017 CLINICAL DATA:  77 year old female with acute LEFT abdominal and pelvic pain for 10 days. EXAM: CT ABDOMEN AND PELVIS WITHOUT CONTRAST TECHNIQUE: Multidetector CT imaging of the abdomen and pelvis was performed following the standard protocol without IV contrast. COMPARISON:  None. FINDINGS: Please note that parenchymal abnormalities may be missed without intravenous contrast. Lower chest: No acute abnormality Hepatobiliary: Mild hepatic steatosis noted without focal hepatic abnormality. Cholelithiasis identified without CT evidence of acute cholecystitis. No  biliary dilatation. Pancreas: Atrophic without other significant abnormality. Spleen: Unremarkable Adrenals/Urinary Tract: Bilateral renal atrophy identified. No hydronephrosis, renal calculi or adrenal abnormality. The bladder is unremarkable. Stomach/Bowel: A 9.1 x 6.5 x 12.5 cm collection with gas along the low LEFT pelvic wall is compatible with an abscess. There appears to be a connection of this abscess to the mid sigmoid colon. There is no evidence of bowel obstruction. Colonic diverticulosis noted. The appendix is normal. Vascular/Lymphatic: Aortic atherosclerosis. No enlarged abdominal or pelvic lymph nodes. Reproductive: A 3.8 cm RIGHT ovarian cyst is present. The LEFT ovary is unremarkable. Patient is status post hysterectomy. Other: No ascites or pneumoperitoneum. Musculoskeletal: No acute or suspicious bony abnormalities. Degenerative changes within the lumbar spine are noted. IMPRESSION: 1. 9.1 x 6.5 x 12.5 cm abscess along the low LEFT pelvic wall with apparent connection to the mid  sigmoid colon which is likely the source. 2. 3.8 cm RIGHT ovarian cyst. Pelvic ultrasound evaluation recommended. 3. Hepatic steatosis. 4. Cholelithiasis. 5.  Aortic Atherosclerosis (ICD10-I70.0). Electronically Signed   By: Margarette Canada M.D.   On: 07/15/2017 16:20    Labs:  CBC: Recent Labs    07/15/17 1252 07/16/17 0518  WBC 12.8* 10.5  HGB 13.4 11.6*  HCT 40.9 34.2*  PLT 430 342    COAGS: Recent Labs    07/16/17 0942  INR 1.39    BMP: Recent Labs    07/15/17 1252 07/16/17 0518  NA 135 137  K 3.8 4.0  CL 100* 105  CO2 26 25  GLUCOSE 161* 186*  BUN 32* 31*  CALCIUM 8.7* 8.2*  CREATININE 1.85* 1.64*  GFRNONAA 26* 30*  GFRAA 30* 34*    LIVER FUNCTION TESTS: Recent Labs    07/15/17 1252  BILITOT 0.9  AST 64*  ALT 58*  ALKPHOS 104  PROT 8.0  ALBUMIN 2.9*    TUMOR MARKERS: No results for input(s): AFPTM, CEA, CA199, CHROMGRNA in the last 8760 hours.  Assessment and  Plan: LLQ/pelvic abscess, likely diverticular Imaging reviewed, plan for CT guided percutaneous drain placement Labs reviewed. Risks and benefits discussed with the patient including bleeding, infection, damage to adjacent structures, bowel perforation/fistula connection, and sepsis.  All of the patient's questions were answered, patient is agreeable to proceed. Consent signed and in chart.    Thank you for this interesting consult.  I greatly enjoyed meeting Fotini Lemus Schlatter and look forward to participating in their care.  A copy of this report was sent to the requesting provider on this date.  Electronically Signed: Ascencion Dike, PA-C 07/16/2017, 11:47 AM   I spent a total of 20 minutes in face to face in clinical consultation, greater than 50% of which was counseling/coordinating care for abscess drain

## 2017-07-16 NOTE — Progress Notes (Signed)
[facility] NAME: Brenda Gregory    MR#:  [id number]  DATE OF BIRTH:  1940-12-29  SUBJECTIVE:   Patient admitted to the hospital secondary to abdominal pain and noted to have a pelvic wall abscess.  Patient admitted to the surgical service and hospitalist service contacted for medical management.  Patient going for CT-guided abscess drainage later today.  No fever, nausea, vomiting today.  REVIEW OF SYSTEMS:    Review of Systems  Constitutional: Negative for chills and fever.  HENT: Negative for congestion and tinnitus.   Eyes: Negative for blurred vision and double vision.  Respiratory: Negative for cough, shortness of breath and wheezing.   Cardiovascular: Negative for chest pain, orthopnea and PND.  Gastrointestinal: Positive for abdominal pain. Negative for diarrhea, nausea and vomiting.  Genitourinary: Negative for dysuria and hematuria.  Neurological: Negative for dizziness, sensory change and focal weakness.  All other systems reviewed and are negative.   Nutrition: npo Tolerating Diet: No Tolerating PT: Ambulatory  DRUG ALLERGIES:  No Known Allergies  VITALS:  Blood pressure 106/63, pulse 73, temperature 98.1 F (36.7 C), temperature source Oral, resp. rate 16, height 5\' 5"  (1.651 m), weight 91 kg (200 lb 9.6 oz), SpO2 100 %.  PHYSICAL EXAMINATION:   Physical Exam  GENERAL:  77 y.o.-year-old patient siting up in chair in no acute distress.  EYES: Pupils equal, round, reactive to light and accommodation. No scleral icterus. Extraocular muscles intact.  HEENT: Head atraumatic, normocephalic. Oropharynx and nasopharynx clear.  NECK:  Supple, no jugular venous distention. No thyroid enlargement, no tenderness.  LUNGS: Normal breath sounds bilaterally, no wheezing, rales, rhonchi. No use of accessory muscles of respiration.  CARDIOVASCULAR: S1, S2 normal. No murmurs, rubs, or gallops.  ABDOMEN: Soft, Tender in the LLQ  no rebound, rigidity, nondistended. Bowel sounds present. No organomegaly or mass.  EXTREMITIES: No cyanosis, clubbing or edema b/l.    NEUROLOGIC: Cranial nerves II through XII are intact. No focal Motor or sensory deficits b/l.   PSYCHIATRIC: The patient is alert and oriented x 3.  SKIN: No obvious rash, lesion, or ulcer.    LABORATORY PANEL:   CBC Recent Labs  Lab 07/16/17 0518  WBC 10.5  HGB 11.6*  HCT 34.2*  PLT 342   ------------------------------------------------------------------------------------------------------------------  Chemistries  Recent Labs  Lab 07/15/17 1252 07/16/17 0518  NA 135 137  K 3.8 4.0  CL 100* 105  CO2 26 25  GLUCOSE 161* 186*  BUN 32* 31*  CREATININE 1.85* 1.64*  CALCIUM 8.7* 8.2*  AST 64*  --   ALT 58*  --   ALKPHOS 104  --   BILITOT 0.9  --    ------------------------------------------------------------------------------------------------------------------  Cardiac Enzymes No results for input(s): TROPONINI in the last 168 hours. ------------------------------------------------------------------------------------------------------------------  RADIOLOGY:  Ct Abdomen Pelvis Wo Contrast  Result Date: 07/15/2017 CLINICAL DATA:  77 year old female with acute LEFT abdominal and pelvic pain for 10 days. EXAM: CT ABDOMEN AND PELVIS WITHOUT CONTRAST TECHNIQUE: Multidetector CT imaging of the abdomen and pelvis was performed following the standard protocol without IV contrast. COMPARISON:  None. FINDINGS: Please note that parenchymal abnormalities may be missed without intravenous contrast. Lower chest: No acute abnormality Hepatobiliary: Mild hepatic steatosis noted without focal hepatic abnormality. Cholelithiasis identified without CT evidence of acute cholecystitis. No biliary dilatation. Pancreas: Atrophic without other significant abnormality. Spleen: Unremarkable Adrenals/Urinary Tract: Bilateral renal atrophy identified. No  hydronephrosis, renal calculi or adrenal abnormality. The bladder is unremarkable. Stomach/Bowel:  A 9.1 x 6.5 x 12.5 cm collection with gas along the low LEFT pelvic wall is compatible with an abscess. There appears to be a connection of this abscess to the mid sigmoid colon. There is no evidence of bowel obstruction. Colonic diverticulosis noted. The appendix is normal. Vascular/Lymphatic: Aortic atherosclerosis. No enlarged abdominal or pelvic lymph nodes. Reproductive: A 3.8 cm RIGHT ovarian cyst is present. The LEFT ovary is unremarkable. Patient is status post hysterectomy. Other: No ascites or pneumoperitoneum. Musculoskeletal: No acute or suspicious bony abnormalities. Degenerative changes within the lumbar spine are noted. IMPRESSION: 1. 9.1 x 6.5 x 12.5 cm abscess along the low LEFT pelvic wall with apparent connection to the mid sigmoid colon which is likely the source. 2. 3.8 cm RIGHT ovarian cyst. Pelvic ultrasound evaluation recommended. 3. Hepatic steatosis. 4. Cholelithiasis. 5.  Aortic Atherosclerosis (ICD10-I70.0). Electronically Signed   By: Margarette Canada M.D.   On: 07/15/2017 16:20     ASSESSMENT AND PLAN:   77 year old female with past medical history of attention, gout, diabetes, osteoarthritis, asthma who presents to the hospital due to abdominal pain and noted to have a pelvic wall abscess with connection to the sigmoid colon.  1.  Peri- colonic abscess-this is the cause of patient's worsening abdominal pain.  Patient noted to have a large abscess on the CT scan of the abdomen pelvis on admission.  Continue broad-spectrum IV antibiotics with Zosyn for now. -As per surgery patient is to go for CT-guided drainage of the abscess today. -Continue further care as per surgery.  2.  Diabetes type 2 without complications- continue sliding scale insulin, follow blood sugars.  3.  Essential hypertension-continue lisinopril.  Next  4.  hx of gout-no acute attack.  Hold allopurinol for  now.     All the records are reviewed and case discussed with Care Management/Social Worker. Management plans discussed with the patient, family and they are in agreement.  CODE STATUS: Full code  DVT Prophylaxis: Lovenox  TOTAL TIME TAKING CARE OF THIS PATIENT: 30 minutes.   POSSIBLE D/C IN 1-2 DAYS, DEPENDING ON CLINICAL CONDITION.   Henreitta Leber M.D on 07/16/2017 at 2:18 PM  Between 7am to 6pm - Pager - [phone]  After 6pm go to [email or web address] - Proofreader  [facility]  Office  [phone]  CC: Primary care physician; Derinda Late, MD

## 2017-07-16 NOTE — Progress Notes (Signed)
Initial Nutrition Assessment  DOCUMENTATION CODES:   Not applicable  INTERVENTION:   When diet advances to clear liquid, Boost Breeze po TID, each supplement provides 250 kcal and 9 grams of protein  When diet advances from clear liquid, Premier Protein BID, each supplement provides 160 kcal and 30 grams of protein.   NUTRITION DIAGNOSIS:   Increased nutrient needs related to acute illness, wound healing(abscess on sigmoid colon) as evidenced by increased estimated needs.  GOAL:   Patient will meet greater than or equal to 90% of their needs  MONITOR:   PO intake, Diet advancement, Supplement acceptance, Labs, I & O's, Weight trends  REASON FOR ASSESSMENT:   Malnutrition Screening Tool   ASSESSMENT:  77 y.o. female with a past medical history of asthma, arthritis, diabetes, hypertension, presents to the emergency department for left lower quadrant abdominal pain. States she has lost weight due to decreased appetite over the past 10 days as well.  Not drinking or eating much. CT appears to show a perforated sigmoid colon with 9 x 12 abscess. Likely diverticular in origin.   Met with pt and pt's daughter in room today. Pt reports poor appetite and poor po intake for a couple months pta. Two weeks pta, pt's daughter reports pt was only eating a bowl of cereal occasionally.  Pt reports a 10 lb wt. loss in one week. Unable to confirm this in pt chart. Pt reports a UBW of 225-235 lbs and claims she weighed this a few months ago.   Pt reports no problems chewing or swallowing. Pt reports no nausea, vomiting, constipation, or diarrhea.   Pt denies any previous GI issues or diverticulitis flares. RD provided "Nutrition and Diverticulitis" handout with supporting information. Provided examples of low and high fiber foods. Discouraged intake of high fiber foods, high fat foods, spicy foods, processed foods, caffeine and red meats when having a flare. Encouraged pt to cook foods until they  are soft and chew foods well to help aid in digestion. Also recommend frequent small meals. Encouraged use of a multi-vitamin and protein supplements while having a flare.    RD encouraged intake of high fiber foods when not having a flare. Pt verbalized understanding.  Medications reviewed and include: lovenox, novolog, lisinopril, dextrose 5% in lactated ringers infusion, piperacillin-tazobactam, morphine  Labs reviewed: BUN 31(H), Creatinine 1.64(H), GFR 30(L), RDW 14.6(H), Prothrombin time 16.9(H)  NUTRITION - FOCUSED PHYSICAL EXAM:    Most Recent Value  Orbital Region  Mild depletion  Upper Arm Region  No depletion  Thoracic and Lumbar Region  Unable to assess  Buccal Region  No depletion  Temple Region  Mild depletion  Clavicle Bone Region  No depletion  Clavicle and Acromion Bone Region  No depletion  Scapular Bone Region  Unable to assess  Dorsal Hand  No depletion  Patellar Region  Unable to assess  Anterior Thigh Region  Unable to assess  Posterior Calf Region  Unable to assess  Edema (RD Assessment)  Mild  Hair  Reviewed  Eyes  Reviewed  Mouth  Reviewed  Skin  Reviewed  Nails  Reviewed     Diet Order:  Diet NPO time specified Except for: Ice Chips  EDUCATION NEEDS:   Education needs have been addressed  Skin:  Skin Assessment: Reviewed RN Assessment  Last BM:  07/15/17(per nurse)  Height:   Ht Readings from Last 1 Encounters:  07/15/17 5' 5"  (1.651 m)   Weight:   Wt Readings from Last 1 Encounters:  07/15/17 200 lb 9.6 oz (91 kg)   Ideal Body Weight:  62.5 kg  BMI:  Body mass index is 33.38 kg/m.  Estimated Nutritional Needs:   Kcal:  1700-2000 kcals (MSJ ABW x 1.2-1.4)  Protein:  69-81 g/day (IBW x 1.1-1.3)  Fluid:  <1.7 L/day  Alfonse Ras, [place] [facility] Intern [phone]

## 2017-07-16 NOTE — Procedures (Signed)
Diverticular abscess  S/p CT drain  Fecal contaminated fld aspirated  cx sent  No comp EBL 0 To suction bulb Full report in pacs

## 2017-07-16 NOTE — Progress Notes (Signed)
ADDENDUM: Patient underwent image-guided percutaneous LLQ abscess drainage, brown purulent fluid obtained, cultured.  Okay to start clear liquids diet, GI consultation and CEA pending.  -- Marilynne Drivers. [name], MD, [place]: [facility] - Partnering for exceptional care. Office: [phone]       SURGICAL PROGRESS NOTE (cpt [phone])  Hospital Day(s): 1.   Post op day(s):  Marland Kitchen   Interval History: Patient seen and examined, no acute events or new complaints overnight. Patient reports her LLQ abdominal pain has nearly resolved with +flatus and +BM, and she denies any N/V, fever/chills, CP, or SOB.  Review of Systems:  Constitutional: denies fever, chills  HEENT: denies cough or congestion  Respiratory: denies any shortness of breath  Cardiovascular: denies chest pain or palpitations  Gastrointestinal: abdominal pain, N/V, and bowel function as per interval history Genitourinary: denies burning with urination or urinary frequency Musculoskeletal: denies pain, decreased motor or sensation Integumentary: denies any other rashes or skin discolorations Neurological: denies HA or vision/hearing changes   Vital signs in last 24 hours: [min-max] current  Temp:  [98.1 F (36.7 C)-98.8 F (37.1 C)] 98.1 F (36.7 C) (04/19 0439) Pulse Rate:  [77-97] 77 (04/19 0439) Resp:  [16-20] 16 (04/19 0439) BP: (113-136)/(57-74) 128/70 (04/19 0439) SpO2:  [93 %-99 %] 99 % (04/19 0439) Weight:  [200 lb (90.7 kg)-200 lb 9.6 oz (91 kg)] 200 lb 9.6 oz (91 kg) (04/18 2000)     Height: 5\' 5"  (165.1 cm) Weight: 200 lb 9.6 oz (91 kg) BMI (Calculated): 33.38   Intake/Output this shift:  No intake/output data recorded.   Intake/Output last 2 shifts:  @IOLAST2SHIFTS @   Physical Exam:  Constitutional: alert, cooperative and no distress  HENT: normocephalic without obvious abnormality  Eyes: PERRL, EOM's grossly intact and symmetric  Neuro: CN II - XII grossly intact  and symmetric without deficit  Respiratory: breathing non-labored at rest  Cardiovascular: regular rate and sinus rhythm  Gastrointestinal: soft, non-tender, and non-distended Musculoskeletal: UE and LE FROM, no edema or wounds, motor and sensation grossly intact, NT   Labs:  CBC Latest Ref Rng & Units 07/16/2017 07/15/2017 01/18/2015  WBC 3.6 - 11.0 K/uL 10.5 12.8(H) 12.8(H)  Hemoglobin 12.0 - 16.0 g/dL 11.6(L) 13.4 12.6  Hematocrit 35.0 - 47.0 % 34.2(L) 40.9 38.5  Platelets 150 - 440 K/uL 342 430 256   CMP Latest Ref Rng & Units 07/16/2017 07/15/2017 01/18/2015  Glucose 65 - 99 mg/dL 186(H) 161(H) 186(H)  BUN 6 - 20 mg/dL 31(H) 32(H) 24(H)  Creatinine 0.44 - 1.00 mg/dL 1.64(H) 1.85(H) 1.29(H)  Sodium 135 - 145 mmol/L 137 135 133(L)  Potassium 3.5 - 5.1 mmol/L 4.0 3.8 3.9  Chloride 101 - 111 mmol/L 105 100(L) 101  CO2 22 - 32 mmol/L 25 26 23   Calcium 8.9 - 10.3 mg/dL 8.2(L) 8.7(L) 9.3  Total Protein 6.5 - 8.1 g/dL - 8.0 -  Total Bilirubin 0.3 - 1.2 mg/dL - 0.9 -  Alkaline Phos 38 - 126 U/L - 104 -  AST 15 - 41 U/L - 64(H) -  ALT 14 - 54 U/L - 58(H) -   CEA (07/16/2017): pending   Imaging studies: No new pertinent imaging studies   Assessment/Plan: (ICD-10's: K32.20) 77 y.o. female with a 9.1 x 6.5 x 12.5 cm peri-colonic abscess into abdominal wall adjacent to sigmoid colon, concerning for eroding colon cancer vs diverticulitis, complicated by AKI and resolved relatively mild leukocytosis and by pertinent comorbidities including obesity (BMI >33), DM, HTN,  gout, osteoarthritis, and former tobacco abuse (smoking).   - NPO for now, IVfluids +bolus - pain control prn, minimize narcotics  - IR image-guided abscess drainage later today - IV antibiotics (agree with Zosyn ordered by ED, will continue)             - follow-up CEA, GI consultation requested for possible sigmoidoscopy vs colonoscopy             - medical consultation for medical  management of comorbidities appreciated - continue to monitor abdominal exam and bowel function - DVT prophylaxis, ambulation encouraged  All of the above findings and recommendations were discussed with the patient, and all of patient's questions were answered to her expressed satisfaction.  -- Marilynne Drivers [name], MD, [place]: [facility] - Partnering for exceptional care. Office: [phone]

## 2017-07-16 NOTE — Consult Note (Signed)
Brenda Darby, MD [address]  [place]  [place], [place] [address]  Main: [phone]  Fax: [phone] Pager: [phone]   Consultation  Referring Provider:     No ref. provider found Primary Care Physician:  Derinda Late, MD Primary Gastroenterologist: none         Reason for Consultation:  pericolonic abscess  Date of Admission:  07/15/2017 Date of Consultation:  07/16/2017         HPI:   Brenda Gregory is a 77 y.o. Caucasian female with metabolic syndrome, presents with one and half weeks history of left lower quadrant pain, weight loss, fevers and chills. She is empirically started on Cipro and Flagyl for diverticulitis by her primary care physician. Her symptoms got worse and she presented to ER yesterday. She is found to have a pericolonic abscess in left lower quadrant based on the CT on admission. She underwent CT-guided drainage of the abscess and drain placement today. Surgery is on board. GI is consulted for possible flexible sigmoidoscopy to evaluate the left colon to rule out malignancy.   NSAIDs: none  Antiplts/Anticoagulants/Anti thrombotics: none  GI Procedures:  her brother died from colon cancer at 66 years old, she has never had a colonoscopy performed   Past Medical History:  Diagnosis Date  . Arthritis    knees, right foot  . Asthma    as child  . Diabetes mellitus without complication ([place])   . Gout   . Hypertension     Past Surgical History:  Procedure Laterality Date  . ABDOMINAL HYSTERECTOMY    . CATARACT EXTRACTION W/PHACO Left 02/25/2016   Procedure: CATARACT EXTRACTION PHACO AND INTRAOCULAR LENS PLACEMENT (IOC);  Surgeon: Eulogio Bear, MD;  Location: [facility];  Service: Ophthalmology;  Laterality: Left;  DIABETIC - oral meds LEFT  . CATARACT EXTRACTION W/PHACO Right 04/07/2016   Procedure: CATARACT EXTRACTION PHACO AND INTRAOCULAR LENS PLACEMENT (IOC);  Surgeon: Eulogio Bear, MD;  Location: [facility];  Service: Ophthalmology;  Laterality: Right;  RIGHT DIABETES - oral meds IVA TOPICAL  . KNEE SURGERY Left     Prior to Admission medications   Medication Sig Start Date End Date Taking? Authorizing Provider  acetaminophen (TYLENOL) 650 MG CR tablet Take 650 mg by mouth as needed for pain.   Yes [provider]  allopurinol (ZYLOPRIM) 300 MG tablet Take 300 mg by mouth daily.   Yes [provider]  ciprofloxacin (CIPRO) 500 MG tablet Take 500 mg by mouth 2 (two) times daily. 07/14/17  Yes [provider]  lisinopril (PRINIVIL,ZESTRIL) 20 MG tablet Take 20 mg by mouth daily.   Yes [provider]  metFORMIN (GLUCOPHAGE) 500 MG tablet Take 500 mg by mouth daily with breakfast.   Yes [provider]  metroNIDAZOLE (FLAGYL) 500 MG tablet Take 500 mg by mouth 2 (two) times daily. 07/14/17 07/24/17 Yes [provider]    Family History  Problem Relation Age of Onset  . Dementia Mother   . Heart attack Father   . Colon cancer Brother      Social History   Tobacco Use  . Smoking status: Former Smoker    Last attempt to quit: 03/30/1978    Years since quitting: 39.3  . Smokeless tobacco: Never Used  Substance Use Topics  . Alcohol use: No  . Drug use: Not on file    Allergies as of 07/15/2017  . (No Known Allergies)  Review of Systems:    All systems reviewed and negative except where noted in HPI.   Physical Exam:  Vital signs in last 24 hours: Temp:  [98.1 F (36.7 C)-98.7 F (37.1 C)] 98.7 F (37.1 C) (04/19 2051) Pulse Rate:  [70-77] 76 (04/19 2051) Resp:  [15-25] 16 (04/19 2051) BP: (106-128)/(43-74) 111/43 (04/19 2051) SpO2:  [96 %-100 %] 97 % (04/19 2051) Last BM Date: 07/15/17 General:   Pleasant, cooperative in NAD Head:  Normocephalic and atraumatic. Eyes:   No icterus.   Conjunctiva pink. PERRLA. Ears:  Normal auditory acuity. Neck:  Supple; no masses or thyroidomegaly Lungs: Respirations even  and unlabored. Lungs clear to auscultation bilaterally.   No wheezes, crackles, or rhonchi.  Heart:  Regular rate and rhythm;  Without murmur, clicks, rubs or gallops Abdomen:  Soft,obese, nondistended, drain in the left lower quadrant, with purulent material in the back. Normal bowel sounds. No appreciable masses or hepatomegaly.  No rebound or guarding.  Rectal:  Not performed. Msk:  Symmetrical without gross deformities.  Strength generalized weakness Extremities:  Without edema, cyanosis or clubbing. Neurologic:  Alert and oriented x3;  grossly normal neurologically. Skin:  Intact without significant lesions or rashes. Psych:  Alert and cooperative. Normal affect.  LAB RESULTS: CBC Latest Ref Rng & Units 07/16/2017 07/15/2017 01/18/2015  WBC 3.6 - 11.0 K/uL 10.5 12.8(H) 12.8(H)  Hemoglobin 12.0 - 16.0 g/dL 11.6(L) 13.4 12.6  Hematocrit 35.0 - 47.0 % 34.2(L) 40.9 38.5  Platelets 150 - 440 K/uL 342 430 256    BMET BMP Latest Ref Rng & Units 07/16/2017 07/15/2017 01/18/2015  Glucose 65 - 99 mg/dL 186(H) 161(H) 186(H)  BUN 6 - 20 mg/dL 31(H) 32(H) 24(H)  Creatinine 0.44 - 1.00 mg/dL 1.64(H) 1.85(H) 1.29(H)  Sodium 135 - 145 mmol/L 137 135 133(L)  Potassium 3.5 - 5.1 mmol/L 4.0 3.8 3.9  Chloride 101 - 111 mmol/L 105 100(L) 101  CO2 22 - 32 mmol/L 25 26 23   Calcium 8.9 - 10.3 mg/dL 8.2(L) 8.7(L) 9.3    LFT Hepatic Function Latest Ref Rng & Units 07/15/2017  Total Protein 6.5 - 8.1 g/dL 8.0  Albumin 3.5 - 5.0 g/dL 2.9(L)  AST 15 - 41 U/L 64(H)  ALT 14 - 54 U/L 58(H)  Alk Phosphatase 38 - 126 U/L 104  Total Bilirubin 0.3 - 1.2 mg/dL 0.9     STUDIES: Ct Abdomen Pelvis Wo Contrast  Result Date: 07/15/2017 CLINICAL DATA:  77 year old female with acute LEFT abdominal and pelvic pain for 10 days. EXAM: CT ABDOMEN AND PELVIS WITHOUT CONTRAST TECHNIQUE: Multidetector CT imaging of the abdomen and pelvis was performed following the standard protocol without IV contrast. COMPARISON:  None.  FINDINGS: Please note that parenchymal abnormalities may be missed without intravenous contrast. Lower chest: No acute abnormality Hepatobiliary: Mild hepatic steatosis noted without focal hepatic abnormality. Cholelithiasis identified without CT evidence of acute cholecystitis. No biliary dilatation. Pancreas: Atrophic without other significant abnormality. Spleen: Unremarkable Adrenals/Urinary Tract: Bilateral renal atrophy identified. No hydronephrosis, renal calculi or adrenal abnormality. The bladder is unremarkable. Stomach/Bowel: A 9.1 x 6.5 x 12.5 cm collection with gas along the low LEFT pelvic wall is compatible with an abscess. There appears to be a connection of this abscess to the mid sigmoid colon. There is no evidence of bowel obstruction. Colonic diverticulosis noted. The appendix is normal. Vascular/Lymphatic: Aortic atherosclerosis. No enlarged abdominal or pelvic lymph nodes. Reproductive: A 3.8 cm RIGHT ovarian cyst is present. The LEFT ovary is unremarkable. Patient  is status post hysterectomy. Other: No ascites or pneumoperitoneum. Musculoskeletal: No acute or suspicious bony abnormalities. Degenerative changes within the lumbar spine are noted. IMPRESSION: 1. 9.1 x 6.5 x 12.5 cm abscess along the low LEFT pelvic wall with apparent connection to the mid sigmoid colon which is likely the source. 2. 3.8 cm RIGHT ovarian cyst. Pelvic ultrasound evaluation recommended. 3. Hepatic steatosis. 4. Cholelithiasis. 5.  Aortic Atherosclerosis (ICD10-I70.0). Electronically Signed   By: Margarette Canada M.D.   On: 07/15/2017 16:20   Ct Image Guided Fluid Drain By Catheter  Result Date: 07/16/2017 INDICATION: Left lower quadrant diverticular abscess extending into the abdominal wall EXAM: CT GUIDED DRAINAGE OF DIVERTICULAR ABSCESS MEDICATIONS: The patient is currently admitted to the hospital and receiving intravenous antibiotics. The antibiotics were administered within an appropriate time frame prior to the  initiation of the procedure. ANESTHESIA/SEDATION: 0.5 mg IV Versed 50 mcg IV Fentanyl Moderate Sedation Time:  13 MINUTES The patient was continuously monitored during the procedure by the interventional radiology nurse under my direct supervision. COMPLICATIONS: None immediate. TECHNIQUE: Informed written consent was obtained from the patient after a thorough discussion of the procedural risks, benefits and alternatives. All questions were addressed. Maximal Sterile Barrier Technique was utilized including caps, mask, sterile gowns, sterile gloves, sterile drape, hand hygiene and skin antiseptic. A timeout was performed prior to the initiation of the procedure. PROCEDURE: previous imaging reviewed. patient positioned supine. noncontrast localization ct performed. the left lower quadrant diverticular abscess extending into the abdominal wall was localized and marked. under sterile conditions and local anesthesia, an 18 gauge needle was advanced from a left oblique approach into the abscess. needle position confirmed with ct. amplatz guidewire inserted followed by tract dilatation to insert a 10 french drain. drain catheter position confirmed with ct. syringe aspiration yielded fecal contaminated fluid. sample sent culture. catheter secured with prolene suture and a sterile dressing. external suction bulb connected. no immediate complication. patient tolerated the procedure well. FINDINGS: CT imaging confirms percutaneous needle access of the left lower quadrant diverticular abscess for drain insertion IMPRESSION: Successful CT-guided diverticular abscess drain as above. Electronically Signed   By: Jerilynn Mages.  Shick M.D.   On: 07/16/2017 15:34      Impression / Plan:   Brenda Gregory is a 77 y.o. Caucasian female with metabolic syndrome, first-degree relative with colon cancer at age 55, presents with pericolonic abscess status post drain placement.  Endoscopic evaluation in the setting of perforated pericolonic  abscess is contraindicated due to worsening of infection and worsening of bowel perforation Please refer patient to GI as outpatient after healing of the abscess Further management per surgical team  Thank you for involving me in the care of this patient.  Please consult GI back with questions/concerns    LOS: 1 day   Sherri Sear, MD  07/16/2017, 9:13 PM   Note: This dictation was prepared with Dragon dictation along with smaller phrase technology. Any transcriptional errors that result from this process are unintentional.

## 2017-07-17 ENCOUNTER — Inpatient Hospital Stay: Payer: Self-pay

## 2017-07-17 ENCOUNTER — Encounter: Payer: Self-pay | Admitting: Anesthesiology

## 2017-07-17 LAB — BASIC METABOLIC PANEL
ANION GAP: 5 (ref 5–15)
BUN: 24 mg/dL — ABNORMAL HIGH (ref 6–20)
CO2: 25 mmol/L (ref 22–32)
Calcium: 8.1 mg/dL — ABNORMAL LOW (ref 8.9–10.3)
Chloride: 108 mmol/L (ref 101–111)
Creatinine, Ser: 1.5 mg/dL — ABNORMAL HIGH (ref 0.44–1.00)
GFR calc Af Amer: 38 mL/min — ABNORMAL LOW (ref >60)
GFR, EST NON AFRICAN AMERICAN: 33 mL/min — AB (ref >60)
GLUCOSE: 172 mg/dL — AB (ref 65–99)
POTASSIUM: 4.2 mmol/L (ref 3.5–5.1)
SODIUM: 138 mmol/L (ref 135–145)

## 2017-07-17 LAB — URINALYSIS, COMPLETE (UACMP) WITH MICROSCOPIC
BILIRUBIN URINE: NEGATIVE
GLUCOSE, UA: NEGATIVE mg/dL
HGB URINE DIPSTICK: NEGATIVE
Ketones, ur: NEGATIVE mg/dL
NITRITE: NEGATIVE
Protein, ur: NEGATIVE mg/dL
SPECIFIC GRAVITY, URINE: 1.02 (ref 1.005–1.030)
pH: 5 (ref 5.0–8.0)

## 2017-07-17 LAB — GLUCOSE, CAPILLARY
GLUCOSE-CAPILLARY: 155 mg/dL — AB (ref 65–99)
GLUCOSE-CAPILLARY: 136 mg/dL — AB (ref 65–99)
Glucose-Capillary: 146 mg/dL — ABNORMAL HIGH (ref 65–99)
Glucose-Capillary: 187 mg/dL — ABNORMAL HIGH (ref 65–99)
Glucose-Capillary: 192 mg/dL — ABNORMAL HIGH (ref 65–99)

## 2017-07-17 LAB — CEA: CEA: 1.4 ng/mL (ref 0.0–4.7)

## 2017-07-17 LAB — MAGNESIUM: Magnesium: 1.7 mg/dL (ref 1.7–2.4)

## 2017-07-17 LAB — PHOSPHORUS: Phosphorus: 3 mg/dL (ref 2.5–4.6)

## 2017-07-17 MED ORDER — CLINIMIX/DEXTROSE (5/15) 5 % IV SOLN
INTRAVENOUS | Status: AC
  Administered 2017-07-17: 18:00:00 via INTRAVENOUS
  Filled 2017-07-17: qty 960

## 2017-07-17 MED ORDER — SODIUM CHLORIDE 0.9% FLUSH
10.0000 mL | Freq: Two times a day (BID) | INTRAVENOUS | Status: DC
  Administered 2017-07-17: 10 mL
  Administered 2017-07-18: 40 mL
  Administered 2017-07-18 – 2017-07-21 (×4): 10 mL
  Administered 2017-07-21: 20 mL
  Administered 2017-07-21: 40 mL
  Administered 2017-07-22 – 2017-07-24 (×4): 10 mL

## 2017-07-17 MED ORDER — SODIUM CHLORIDE 0.9% FLUSH: 10.0000 mL | INTRAVENOUS | Status: DC | PRN

## 2017-07-17 MED ORDER — PEG 3350-KCL-NA BICARB-NACL 420 G PO SOLR
4000.0000 mL | Freq: Once | ORAL | Status: AC
  Administered 2017-07-17: 4000 mL via ORAL
  Filled 2017-07-17: qty 4000

## 2017-07-17 NOTE — Progress Notes (Signed)
Spoke with Charge Nurse re PICC to be placed today for TNA.   RN to call CVW.

## 2017-07-17 NOTE — Consult Note (Signed)
Pharmacy consulted for TPN. Pt is a 77 year old female, currently on a clear liquid diet-scheduled for a colectomy with more likely colostomy tomorrow. Expect a prolonged period of NPO w/ possible illus.  Will start Clinimix 5/15 w/out E at 63ml/hr. PICC to be placed today Electrolytes are WNL- no supplementation needed Pt already on SSI q 4 hr TPN labs ordered for the AM Further recommendations tomorrow by the dietician  Ramond Dial, Pharm.D, BCPS Clinical Pharmacist

## 2017-07-17 NOTE — Progress Notes (Addendum)
ADDENDUM: Patient states she likely won't return if she goes home without surgery and has decided to proceed with partial colectomy tomorrow with likely colostomy or diverting ileostomy. PICC, TPN, prep, labs, and NPO after midnight ordered. All risks, benefits, and alternatives to partial colectomy with planned likely colostomy or diverting ileostomy discussed with patient and her daughter, all of their questions were answered to their expressed satisfaction, and informed consent was obtained and attested to in [place].  All of the above were discussed with OR and patient's RN.  -- Marilynne Drivers. [name], MD, [place]: [facility] - Partnering for exceptional care. Office: [phone]       SURGICAL PROGRESS NOTE (cpt [phone])  Hospital Day(s): 2.   Post op day(s):  Marland Kitchen   Interval History: Patient seen and examined, no acute events or new complaints overnight. Patient reports resolution of her LLQ abdominal pain with +flatus and tolerating clear liquids diet without fever/chills, N/V, CP, or SOB. She adds that it's been easier for her now to get up and walk around with less pain or discomfort despite presence of her drain.  Review of Systems:  Constitutional: denies fever, chills  HEENT: denies cough or congestion  Respiratory: denies any shortness of breath  Cardiovascular: denies chest pain or palpitations  Gastrointestinal: abdominal pain, N/V, and bowel function as per interval history Genitourinary: denies burning with urination or urinary frequency Musculoskeletal: denies pain, decreased motor or sensation Integumentary: denies any other rashes or skin discolorations except LLQ abdominal drain Neurological: denies HA or vision/hearing changes   Vital signs in last 24 hours: [min-max] current  Temp:  [98.5 F (36.9 C)-98.7 F (37.1 C)] 98.5 F (36.9 C) (04/20 0534) Pulse Rate:  [63-76] 63 (04/20 0534) Resp:  [15-25] 16 (04/20 0534) BP:  (106-123)/(43-63) 119/55 (04/20 0534) SpO2:  [96 %-100 %] 96 % (04/20 0534)     Height: 5\' 5"  (165.1 cm) Weight: 200 lb 9.6 oz (91 kg) BMI (Calculated): 33.38   Intake/Output this shift:  No intake/output data recorded.   Intake/Output last 2 shifts:  @IOLAST2SHIFTS @   Physical Exam:  Constitutional: alert, cooperative and no distress  HENT: normocephalic without obvious abnormality  Eyes: PERRL, EOM's grossly intact and symmetric  Neuro: CN II - XII grossly intact and symmetric without deficit  Respiratory: breathing non-labored at rest  Cardiovascular: regular rate and sinus rhythm  Gastrointestinal: soft, non-tender, and non-distended with well-secured drainage catheter to bulb suction, draining thick brown (feculent) purulent foul-smelling fluid without any erythema or drainage around the drain Musculoskeletal: UE and LE FROM, no edema or wounds, motor and sensation grossly intact, NT   Labs:  CBC Latest Ref Rng & Units 07/16/2017 07/15/2017 01/18/2015  WBC 3.6 - 11.0 K/uL 10.5 12.8(H) 12.8(H)  Hemoglobin 12.0 - 16.0 g/dL 11.6(L) 13.4 12.6  Hematocrit 35.0 - 47.0 % 34.2(L) 40.9 38.5  Platelets 150 - 440 K/uL 342 430 256   CMP Latest Ref Rng & Units 07/17/2017 07/16/2017 07/15/2017  Glucose 65 - 99 mg/dL 172(H) 186(H) 161(H)  BUN 6 - 20 mg/dL 24(H) 31(H) 32(H)  Creatinine 0.44 - 1.00 mg/dL 1.50(H) 1.64(H) 1.85(H)  Sodium 135 - 145 mmol/L 138 137 135  Potassium 3.5 - 5.1 mmol/L 4.2 4.0 3.8  Chloride 101 - 111 mmol/L 108 105 100(L)  CO2 22 - 32 mmol/L 25 25 26   Calcium 8.9 - 10.3 mg/dL 8.1(L) 8.2(L) 8.7(L)  Total Protein 6.5 - 8.1 g/dL - - 8.0  Total Bilirubin 0.3 - 1.2  mg/dL - - 0.9  Alkaline Phos 38 - 126 U/L - - 104  AST 15 - 41 U/L - - 64(H)  ALT 14 - 54 U/L - - 58(H)   CEA (07/16/2017): 1.4 (WNL)  Imaging studies: No new pertinent imaging studies   Assessment/Plan: (ICD-10's: K57.20) 77 y.o.femalewith a 9.1 x 6.5 x 12.5cm peri-colonic abscess into her LLQ abdominal  wall, adjacent to and appearing to track from her sigmoid colon along with poor appetite x 2 months, LLQ pain x 2 weeks, 15 lbs weight loss over past 1.5 weeks, brother who died due to colon cancer when he was 75 years old, and patient with no prior screening colonoscopy, concerning for eroding colon cancer despite CEA WNL, non-visualization of obvious mass on non-contrast CT, and no known gross blood per rectum with less likely diverticulitis unable to be excluded, complicated byslowly improving AKI,malnutrition, and resolved leukocytosis, along with comorbidities includingobesity (BMI >33), DM, HTN, gout, osteoarthritis, and former tobacco abuse (smoking).   - IV antibiotics (Zosyn) - GI consultation appreciated - clear liquids diet for now, IVfluids - pain control prn, minimize narcotics             - IR image-guided abscess drainage appreciated (4/19)  - discussed extensively with patient and her daughter likely colon cancer vs less likely diverticular abscess  - also discussed extensively with patient and her daughter prompt colectomy with more likely colostomy vs continue to treat abscess with drainage/antibiotics and malnutrition followed by outpatient colonoscopy and anticipated colectomy with less likely colostomy, but delayed surgical resection of suspected colon malignancy  - alternatively, may consider and discussed PICC and TPN, followed by bowel prep and colectomy +/- colostomy  - patient and her daughter express understanding and request time to consider these options - medical consultation for medical management of comorbidities appreciated - continue to monitor abdominal exam and bowel function - DVT prophylaxis, ambulation encouraged  All of the above findings and recommendations were discussed with the patient and her daughter, and all of patient's and her daughter's questions were answered to  their expressed satisfaction.  -- Marilynne Drivers [name], MD, [place]: [facility] - Partnering for exceptional care. Office: [phone]

## 2017-07-17 NOTE — Progress Notes (Signed)
Patient started on Bowel Prep. Drank two cups quickly. No BM yet but nauseous.

## 2017-07-17 NOTE — Progress Notes (Signed)
[place] at [place] NAME: Brenda Gregory    MR#:  [id number]  DATE OF BIRTH:  06/05/1940  SUBJECTIVE:   Patient is status post CT-guided drainage of the left peri-colonic abscess.  Still having some abdominal pain.  Seen by general surgery and discussed with patient extensively about surgical options and patient has agreed to laparotomy with diverting colostomy/ileostomy possibly for tomorrow.  As per surgery they think this is likely underlying colon cancer.  REVIEW OF SYSTEMS:    Review of Systems  Constitutional: Negative for chills and fever.  HENT: Negative for congestion and tinnitus.   Eyes: Negative for blurred vision and double vision.  Respiratory: Negative for cough, shortness of breath and wheezing.   Cardiovascular: Negative for chest pain, orthopnea and PND.  Gastrointestinal: Positive for abdominal pain. Negative for diarrhea, nausea and vomiting.  Genitourinary: Negative for dysuria and hematuria.  Neurological: Negative for dizziness, sensory change and focal weakness.  All other systems reviewed and are negative.   Nutrition: Clear Liquid Tolerating Diet: Yes Tolerating PT: Ambulatory  DRUG ALLERGIES:  No Known Allergies  VITALS:  Blood pressure (!) 131/58, pulse 70, temperature 98.2 F (36.8 C), temperature source Oral, resp. rate 16, height 5\' 5"  (1.651 m), weight 91 kg (200 lb 9.6 oz), SpO2 98 %.  PHYSICAL EXAMINATION:   Physical Exam  GENERAL:  77 y.o.-year-old patient siting up in chair in no acute distress.  EYES: Pupils equal, round, reactive to light and accommodation. No scleral icterus. Extraocular muscles intact.  HEENT: Head atraumatic, normocephalic. Oropharynx and nasopharynx clear.  NECK:  Supple, no jugular venous distention. No thyroid enlargement, no tenderness.  LUNGS: Normal breath sounds bilaterally, no wheezing, rales, rhonchi. No use of accessory muscles of respiration.  CARDIOVASCULAR:  S1, S2 normal. No murmurs, rubs, or gallops.  ABDOMEN: Soft, Tender in the LLQ no rebound, rigidity, nondistended. Bowel sounds present. No organomegaly or mass.  EXTREMITIES: No cyanosis, clubbing or edema b/l.    NEUROLOGIC: Cranial nerves II through XII are intact. No focal Motor or sensory deficits b/l.   PSYCHIATRIC: The patient is alert and oriented x 3.  SKIN: No obvious rash, lesion, or ulcer.    LABORATORY PANEL:   CBC Recent Labs  Lab 07/16/17 0518  WBC 10.5  HGB 11.6*  HCT 34.2*  PLT 342   ------------------------------------------------------------------------------------------------------------------  Chemistries  Recent Labs  Lab 07/15/17 1252  07/17/17 0601  NA 135   < > 138  K 3.8   < > 4.2  CL 100*   < > 108  CO2 26   < > 25  GLUCOSE 161*   < > 172*  BUN 32*   < > 24*  CREATININE 1.85*   < > 1.50*  CALCIUM 8.7*   < > 8.1*  MG  --   --  1.7  AST 64*  --   --   ALT 58*  --   --   ALKPHOS 104  --   --   BILITOT 0.9  --   --    < > = values in this interval not displayed.   ------------------------------------------------------------------------------------------------------------------  Cardiac Enzymes No results for input(s): TROPONINI in the last 168 hours. ------------------------------------------------------------------------------------------------------------------  RADIOLOGY:  Ct Abdomen Pelvis Wo Contrast  Result Date: 07/15/2017 CLINICAL DATA:  77 year old female with acute LEFT abdominal and pelvic pain for 10 days. EXAM: CT ABDOMEN AND PELVIS WITHOUT CONTRAST TECHNIQUE: Multidetector CT imaging of the abdomen and pelvis was  performed following the standard protocol without IV contrast. COMPARISON:  None. FINDINGS: Please note that parenchymal abnormalities may be missed without intravenous contrast. Lower chest: No acute abnormality Hepatobiliary: Mild hepatic steatosis noted without focal hepatic abnormality. Cholelithiasis identified  without CT evidence of acute cholecystitis. No biliary dilatation. Pancreas: Atrophic without other significant abnormality. Spleen: Unremarkable Adrenals/Urinary Tract: Bilateral renal atrophy identified. No hydronephrosis, renal calculi or adrenal abnormality. The bladder is unremarkable. Stomach/Bowel: A 9.1 x 6.5 x 12.5 cm collection with gas along the low LEFT pelvic wall is compatible with an abscess. There appears to be a connection of this abscess to the mid sigmoid colon. There is no evidence of bowel obstruction. Colonic diverticulosis noted. The appendix is normal. Vascular/Lymphatic: Aortic atherosclerosis. No enlarged abdominal or pelvic lymph nodes. Reproductive: A 3.8 cm RIGHT ovarian cyst is present. The LEFT ovary is unremarkable. Patient is status post hysterectomy. Other: No ascites or pneumoperitoneum. Musculoskeletal: No acute or suspicious bony abnormalities. Degenerative changes within the lumbar spine are noted. IMPRESSION: 1. 9.1 x 6.5 x 12.5 cm abscess along the low LEFT pelvic wall with apparent connection to the mid sigmoid colon which is likely the source. 2. 3.8 cm RIGHT ovarian cyst. Pelvic ultrasound evaluation recommended. 3. Hepatic steatosis. 4. Cholelithiasis. 5.  Aortic Atherosclerosis (ICD10-I70.0). Electronically Signed   By: Margarette Canada M.D.   On: 07/15/2017 16:20   Ct Image Guided Fluid Drain By Catheter  Result Date: 07/16/2017 INDICATION: Left lower quadrant diverticular abscess extending into the abdominal wall EXAM: CT GUIDED DRAINAGE OF DIVERTICULAR ABSCESS MEDICATIONS: The patient is currently admitted to the hospital and receiving intravenous antibiotics. The antibiotics were administered within an appropriate time frame prior to the initiation of the procedure. ANESTHESIA/SEDATION: 0.5 mg IV Versed 50 mcg IV Fentanyl Moderate Sedation Time:  13 MINUTES The patient was continuously monitored during the procedure by the interventional radiology nurse under my direct  supervision. COMPLICATIONS: None immediate. TECHNIQUE: Informed written consent was obtained from the patient after a thorough discussion of the procedural risks, benefits and alternatives. All questions were addressed. Maximal Sterile Barrier Technique was utilized including caps, mask, sterile gowns, sterile gloves, sterile drape, hand hygiene and skin antiseptic. A timeout was performed prior to the initiation of the procedure. PROCEDURE: previous imaging reviewed. patient positioned supine. noncontrast localization ct performed. the left lower quadrant diverticular abscess extending into the abdominal wall was localized and marked. under sterile conditions and local anesthesia, an 18 gauge needle was advanced from a left oblique approach into the abscess. needle position confirmed with ct. amplatz guidewire inserted followed by tract dilatation to insert a 10 french drain. drain catheter position confirmed with ct. syringe aspiration yielded fecal contaminated fluid. sample sent culture. catheter secured with prolene suture and a sterile dressing. external suction bulb connected. no immediate complication. patient tolerated the procedure well. FINDINGS: CT imaging confirms percutaneous needle access of the left lower quadrant diverticular abscess for drain insertion IMPRESSION: Successful CT-guided diverticular abscess drain as above. Electronically Signed   By: Jerilynn Mages.  Shick M.D.   On: 07/16/2017 15:34   [place] Ekg Site Rite  Result Date: 07/17/2017 If Site Rite image not attached, placement could not be confirmed due to current cardiac rhythm.    ASSESSMENT AND PLAN:   77 year old female with past medical history of attention, gout, diabetes, osteoarthritis, asthma who presents to the hospital due to abdominal pain and noted to have a pelvic wall abscess with connection to the sigmoid colon.  1.  Peri- colonic abscess-this  is the cause of patient's worsening abdominal pain.  Patient noted to have a large  abscess on the CT scan of the abdomen pelvis on admission.  Continue broad-spectrum IV antibiotics with Zosyn for now. - Patient is status post CT-guided drainage of the peri-colonic abscess yesterday.  Discussed with general surgery today and they think this is likely underlying colon cancer as a CT scan did not show any evidence of diverticulitis.  GI was consulted and they do not recommend endoscopic evaluation given high risk for perforation and worsening of the abscess.  Surgery discussed options with the patient and she has agreed to laparotomy with possible diverting ileostomy/colostomy for tomorrow.  Continue supportive care and further care as per surgery.  2.  Diabetes type 2 without complications- continue sliding scale insulin, follow blood sugars which are stable so far.  3.  Essential hypertension-continue lisinopril.    4.  hx of gout-no acute attack.  Hold allopurinol for now.  5. AKI - due to dehydration and poor PO intake.  - cont. IV fluids and Cr. Improving.    All the records are reviewed and case discussed with Care Management/Social Worker. Management plans discussed with the patient, family and they are in agreement.  CODE STATUS: Full code  DVT Prophylaxis: Lovenox  TOTAL TIME TAKING CARE OF THIS PATIENT: 30 minutes.   POSSIBLE D/C unclear, DEPENDING ON CLINICAL CONDITION.   Henreitta Leber M.D on 07/17/2017 at 1:36 PM  Between 7am to 6pm - Pager - [phone]  After 6pm go to [email or web address] - Proofreader  [facility]  Office  [phone]  CC: Primary care physician; Derinda Late, MD

## 2017-07-18 ENCOUNTER — Encounter
Admission: EM | Disposition: A | Discharge status: Skilled Nursing Facility | Payer: Self-pay | Source: Home / Self Care | Attending: Surgery

## 2017-07-18 ENCOUNTER — Inpatient Hospital Stay: Payer: Medicare HMO | Admitting: Anesthesiology

## 2017-07-18 DIAGNOSIS — K5720 Diverticulitis of large intestine with perforation and abscess without bleeding: Secondary | ICD-10-CM

## 2017-07-18 HISTORY — PX: COLECTOMY WITH COLOSTOMY CREATION/HARTMANN PROCEDURE: SHX6598

## 2017-07-18 LAB — CBC
HEMATOCRIT: 32.3 % — AB (ref 35.0–47.0)
Hemoglobin: 10.7 g/dL — ABNORMAL LOW (ref 12.0–16.0)
MCH: 29.2 pg (ref 26.0–34.0)
MCHC: 33 g/dL (ref 32.0–36.0)
MCV: 88.4 fL (ref 80.0–100.0)
Platelets: 323 10*3/uL (ref 150–440)
RBC: 3.66 MIL/uL — AB (ref 3.80–5.20)
RDW: 14.3 % (ref 11.5–14.5)
WBC: 7.4 10*3/uL (ref 3.6–11.0)

## 2017-07-18 LAB — TYPE AND SCREEN
ABO/RH(D): O NEG
ANTIBODY SCREEN: NEGATIVE

## 2017-07-18 LAB — COMPREHENSIVE METABOLIC PANEL
ALK PHOS: 119 U/L (ref 38–126)
ALT: 29 U/L (ref 14–54)
AST: 33 U/L (ref 15–41)
Albumin: 1.9 g/dL — ABNORMAL LOW (ref 3.5–5.0)
Anion gap: 6 (ref 5–15)
BILIRUBIN TOTAL: 0.7 mg/dL (ref 0.3–1.2)
BUN: 17 mg/dL (ref 6–20)
CALCIUM: 7.7 mg/dL — AB (ref 8.9–10.3)
CO2: 26 mmol/L (ref 22–32)
CREATININE: 1.32 mg/dL — AB (ref 0.44–1.00)
Chloride: 105 mmol/L (ref 101–111)
GFR calc Af Amer: 45 mL/min — ABNORMAL LOW (ref >60)
GFR, EST NON AFRICAN AMERICAN: 39 mL/min — AB (ref >60)
Glucose, Bld: 151 mg/dL — ABNORMAL HIGH (ref 65–99)
Potassium: 3.3 mmol/L — ABNORMAL LOW (ref 3.5–5.1)
Sodium: 137 mmol/L (ref 135–145)
TOTAL PROTEIN: 5.8 g/dL — AB (ref 6.5–8.1)

## 2017-07-18 LAB — DIFFERENTIAL
Basophils Absolute: 0 10*3/uL (ref 0–0.1)
Basophils Relative: 0 %
Eosinophils Absolute: 0.2 10*3/uL (ref 0–0.7)
Eosinophils Relative: 2 %
LYMPHS ABS: 0.8 10*3/uL — AB (ref 1.0–3.6)
LYMPHS PCT: 11 %
MONO ABS: 0.9 10*3/uL (ref 0.2–0.9)
MONOS PCT: 12 %
NEUTROS PCT: 75 %
Neutro Abs: 5.5 10*3/uL (ref 1.4–6.5)

## 2017-07-18 LAB — GLUCOSE, CAPILLARY
GLUCOSE-CAPILLARY: 180 mg/dL — AB (ref 65–99)
GLUCOSE-CAPILLARY: 301 mg/dL — AB (ref 65–99)
Glucose-Capillary: 214 mg/dL — ABNORMAL HIGH (ref 65–99)
Glucose-Capillary: 206 mg/dL — ABNORMAL HIGH (ref 65–99)
Glucose-Capillary: 150 mg/dL — ABNORMAL HIGH (ref 65–99)
Glucose-Capillary: 159 mg/dL — ABNORMAL HIGH (ref 65–99)
Glucose-Capillary: 280 mg/dL — ABNORMAL HIGH (ref 65–99)

## 2017-07-18 LAB — MAGNESIUM: Magnesium: 1.3 mg/dL — ABNORMAL LOW (ref 1.7–2.4)

## 2017-07-18 LAB — PREALBUMIN: PREALBUMIN: 9.6 mg/dL — AB (ref 18–38)

## 2017-07-18 LAB — PHOSPHORUS: Phosphorus: 2.2 mg/dL — ABNORMAL LOW (ref 2.5–4.6)

## 2017-07-18 LAB — SURGICAL PCR SCREEN
MRSA, PCR: NEGATIVE
STAPHYLOCOCCUS AUREUS: NEGATIVE

## 2017-07-18 LAB — TRIGLYCERIDES: Triglycerides: 59 mg/dL (ref <150)

## 2017-07-18 SURGERY — COLECTOMY, WITH COLOSTOMY CREATION
Anesthesia: General | Laterality: Left | Wound class: Dirty or Infected

## 2017-07-18 MED ORDER — MIDAZOLAM HCL 2 MG/2ML IJ SOLN
INTRAMUSCULAR | Status: DC | PRN
  Administered 2017-07-18: 1 mg via INTRAVENOUS

## 2017-07-18 MED ORDER — SUCCINYLCHOLINE CHLORIDE 20 MG/ML IJ SOLN
INTRAMUSCULAR | Status: AC
Start: 2017-07-18 — End: ?
  Filled 2017-07-18: qty 1

## 2017-07-18 MED ORDER — SODIUM CHLORIDE 0.9 % IV SOLN
INTRAVENOUS | Status: DC | PRN
  Administered 2017-07-18 (×3): via INTRAVENOUS

## 2017-07-18 MED ORDER — MUPIROCIN 2 % EX OINT
1.0000 "application " | TOPICAL_OINTMENT | Freq: Two times a day (BID) | CUTANEOUS | Status: DC
  Filled 2017-07-18: qty 22

## 2017-07-18 MED ORDER — SODIUM CHLORIDE 0.9 % IJ SOLN
INTRAMUSCULAR | Status: AC
  Filled 2017-07-18: qty 50

## 2017-07-18 MED ORDER — FENTANYL CITRATE (PF) 100 MCG/2ML IJ SOLN
INTRAMUSCULAR | Status: DC | PRN
  Administered 2017-07-18: 25 ug via INTRAVENOUS
  Administered 2017-07-18 (×3): 50 ug via INTRAVENOUS

## 2017-07-18 MED ORDER — PROPOFOL 10 MG/ML IV BOLUS
INTRAVENOUS | Status: AC
  Filled 2017-07-18: qty 20

## 2017-07-18 MED ORDER — PROPOFOL 10 MG/ML IV BOLUS
INTRAVENOUS | Status: DC | PRN
  Administered 2017-07-18: 100 mg via INTRAVENOUS

## 2017-07-18 MED ORDER — SUCCINYLCHOLINE CHLORIDE 20 MG/ML IJ SOLN
INTRAMUSCULAR | Status: DC | PRN
  Administered 2017-07-18: 80 mg via INTRAVENOUS

## 2017-07-18 MED ORDER — FENTANYL CITRATE (PF) 100 MCG/2ML IJ SOLN
INTRAMUSCULAR | Status: AC
  Filled 2017-07-18: qty 2

## 2017-07-18 MED ORDER — ONDANSETRON HCL 4 MG/2ML IJ SOLN: 4.0000 mg | Freq: Once | INTRAMUSCULAR | Status: DC | PRN

## 2017-07-18 MED ORDER — ONDANSETRON HCL 4 MG/2ML IJ SOLN
INTRAMUSCULAR | Status: AC
  Filled 2017-07-18: qty 2

## 2017-07-18 MED ORDER — ACETAMINOPHEN 500 MG PO TABS
1000.0000 mg | ORAL_TABLET | ORAL | Status: AC
  Administered 2017-07-18: 1000 mg via ORAL
  Filled 2017-07-18: qty 2

## 2017-07-18 MED ORDER — SEVOFLURANE IN SOLN
RESPIRATORY_TRACT | Status: AC
  Filled 2017-07-18: qty 250

## 2017-07-18 MED ORDER — POTASSIUM PHOSPHATES 15 MMOLE/5ML IV SOLN
15.0000 mmol | Freq: Once | INTRAVENOUS | Status: AC
  Administered 2017-07-18: 15 mmol via INTRAVENOUS
  Filled 2017-07-18: qty 5

## 2017-07-18 MED ORDER — LACTATED RINGERS IV SOLN
INTRAVENOUS | Status: DC
  Administered 2017-07-18 – 2017-07-23 (×5): via INTRAVENOUS

## 2017-07-18 MED ORDER — MIDAZOLAM HCL 2 MG/2ML IJ SOLN
INTRAMUSCULAR | Status: AC
  Filled 2017-07-18: qty 2

## 2017-07-18 MED ORDER — BUPIVACAINE LIPOSOME 1.3 % IJ SUSP
INTRAMUSCULAR | Status: DC | PRN
  Administered 2017-07-18: 50 mL

## 2017-07-18 MED ORDER — FLEET ENEMA 7-19 GM/118ML RE ENEM
1.0000 | ENEMA | Freq: Once | RECTAL | Status: AC
  Administered 2017-07-18: 1 via RECTAL

## 2017-07-18 MED ORDER — ALVIMOPAN 12 MG PO CAPS
12.0000 mg | ORAL_CAPSULE | ORAL | Status: AC
  Administered 2017-07-18: 12 mg via ORAL
  Filled 2017-07-18: qty 1

## 2017-07-18 MED ORDER — FAT EMULSION PLANT BASED 20 % IV EMUL
240.0000 mL | INTRAVENOUS | Status: AC
  Administered 2017-07-18: 240 mL via INTRAVENOUS
  Filled 2017-07-18: qty 240

## 2017-07-18 MED ORDER — MAGNESIUM SULFATE 4 GM/100ML IV SOLN
4.0000 g | Freq: Once | INTRAVENOUS | Status: AC
  Administered 2017-07-18: 4 g via INTRAVENOUS
  Filled 2017-07-18: qty 100

## 2017-07-18 MED ORDER — GABAPENTIN 300 MG PO CAPS
300.0000 mg | ORAL_CAPSULE | ORAL | Status: AC
  Administered 2017-07-18: 300 mg via ORAL
  Filled 2017-07-18: qty 1

## 2017-07-18 MED ORDER — GLYCOPYRROLATE 0.2 MG/ML IJ SOLN
INTRAMUSCULAR | Status: DC | PRN
  Administered 2017-07-18: 0.4 mg via INTRAVENOUS

## 2017-07-18 MED ORDER — ENOXAPARIN SODIUM 40 MG/0.4ML ~~LOC~~ SOLN
40.0000 mg | SUBCUTANEOUS | Status: DC
  Administered 2017-07-19 – 2017-07-24 (×6): 40 mg via SUBCUTANEOUS
  Filled 2017-07-18 (×6): qty 0.4

## 2017-07-18 MED ORDER — ONDANSETRON HCL 4 MG/2ML IJ SOLN
INTRAMUSCULAR | Status: DC | PRN
  Administered 2017-07-18: 4 mg via INTRAVENOUS

## 2017-07-18 MED ORDER — NEOSTIGMINE METHYLSULFATE 10 MG/10ML IV SOLN
INTRAVENOUS | Status: DC | PRN
  Administered 2017-07-18: 3 mg via INTRAVENOUS

## 2017-07-18 MED ORDER — ROCURONIUM BROMIDE 100 MG/10ML IV SOLN
INTRAVENOUS | Status: DC | PRN
  Administered 2017-07-18: 50 mg via INTRAVENOUS
  Administered 2017-07-18 (×2): 10 mg via INTRAVENOUS

## 2017-07-18 MED ORDER — FENTANYL CITRATE (PF) 100 MCG/2ML IJ SOLN: 25.0000 ug | INTRAMUSCULAR | Status: DC | PRN

## 2017-07-18 MED ORDER — LIDOCAINE HCL (CARDIAC) PF 100 MG/5ML IV SOSY
PREFILLED_SYRINGE | INTRAVENOUS | Status: DC | PRN
Start: 2017-07-18 — End: 2017-07-18
  Administered 2017-07-18: 30 mg via INTRAVENOUS

## 2017-07-18 MED ORDER — LIDOCAINE HCL (PF) 2 % IJ SOLN
INTRAMUSCULAR | Status: AC
  Filled 2017-07-18: qty 10

## 2017-07-18 MED ORDER — CHLORHEXIDINE GLUCONATE CLOTH 2 % EX PADS
6.0000 | MEDICATED_PAD | Freq: Once | CUTANEOUS | Status: AC
  Administered 2017-07-18: 6 via TOPICAL

## 2017-07-18 MED ORDER — BUPIVACAINE HCL (PF) 0.5 % IJ SOLN
INTRAMUSCULAR | Status: AC
  Filled 2017-07-18: qty 30

## 2017-07-18 MED ORDER — ROCURONIUM BROMIDE 50 MG/5ML IV SOLN
INTRAVENOUS | Status: AC
Start: 2017-07-18 — End: ?
  Filled 2017-07-18: qty 2

## 2017-07-18 MED ORDER — BUPIVACAINE LIPOSOME 1.3 % IJ SUSP
INTRAMUSCULAR | Status: AC
  Filled 2017-07-18: qty 20

## 2017-07-18 MED ORDER — TRACE MINERALS CR-CU-MN-SE-ZN 10-1000-500-60 MCG/ML IV SOLN
INTRAVENOUS | Status: AC
  Administered 2017-07-18: 19:00:00 via INTRAVENOUS
  Filled 2017-07-18: qty 1800

## 2017-07-18 SURGICAL SUPPLY — 50 items
APPLIER CLIP 11 MED OPEN (CLIP)
APPLIER CLIP 13 LRG OPEN (CLIP)
BULB RESERV EVAC DRAIN JP 100C (MISCELLANEOUS) ×6 IMPLANT
CATH ROBINSON RED A/P 16FR (CATHETERS) ×3 IMPLANT
CELL SAVER LIPIGURD (MISCELLANEOUS) IMPLANT
CHLORAPREP W/TINT 26ML (MISCELLANEOUS) ×6 IMPLANT
CLIP APPLIE 11 MED OPEN (CLIP) IMPLANT
CLIP APPLIE 13 LRG OPEN (CLIP) IMPLANT
DRAIN CHANNEL JP 19F (MISCELLANEOUS) ×6 IMPLANT
DRAPE LAPAROTOMY 100X77 ABD (DRAPES) ×3 IMPLANT
DRSG OPSITE POSTOP 4X10 (GAUZE/BANDAGES/DRESSINGS) ×3 IMPLANT
DRSG OPSITE POSTOP 4X8 (GAUZE/BANDAGES/DRESSINGS) IMPLANT
ELECT BLADE 6 FLAT ULTRCLN (ELECTRODE) IMPLANT
ELECT REM PT RETURN 9FT ADLT (ELECTROSURGICAL) ×3
ELECTRODE REM PT RTRN 9FT ADLT (ELECTROSURGICAL) ×1 IMPLANT
EXTRT SYSTEM ALEXIS 14CM (MISCELLANEOUS)
EXTRT SYSTEM ALEXIS 17CM (MISCELLANEOUS)
GLOVE BIO SURGEON STRL SZ7 (GLOVE) ×24 IMPLANT
GLOVE BIOGEL PI IND STRL 7.5 (GLOVE) ×2 IMPLANT
GLOVE BIOGEL PI INDICATOR 7.5 (GLOVE) ×4
GOWN STRL REUS W/TWL LRG LVL3 (GOWN DISPOSABLE) ×21 IMPLANT
HANDLE SUCTION POOLE (INSTRUMENTS) ×1 IMPLANT
HANDLE YANKAUER SUCT BULB TIP (MISCELLANEOUS) ×3 IMPLANT
KIT OSTOMY DRAINABLE 2.75 STR (WOUND CARE) ×3 IMPLANT
KIT OSTOMY DRAINABLE 3.25 STR (WOUND CARE) ×3 IMPLANT
LIGASURE IMPACT 36 18CM CVD LR (INSTRUMENTS) ×6 IMPLANT
NEEDLE HYPO 18GX1.5 BLUNT FILL (NEEDLE) IMPLANT
NEEDLE HYPO 21X1.5 SAFETY (NEEDLE) ×3 IMPLANT
NS IRRIG 1000ML POUR BTL (IV SOLUTION) ×6 IMPLANT
PACK BASIN MAJOR ARMC (MISCELLANEOUS) ×3 IMPLANT
PACK COLON CLEAN CLOSURE (MISCELLANEOUS) ×3 IMPLANT
RELOAD PROXIMATE 75MM BLUE (ENDOMECHANICALS) ×6 IMPLANT
SPONGE LAP 18X18 5 PK (GAUZE/BANDAGES/DRESSINGS) ×3 IMPLANT
STAPLER CUT CVD 40MM BLUE (STAPLE) ×3 IMPLANT
STAPLER PROXIMATE 75MM BLUE (STAPLE) ×3 IMPLANT
STAPLER SKIN PROX 35W (STAPLE) ×3 IMPLANT
STAPLER TA28 THK CONTR EEA XL (STAPLE) ×3 IMPLANT
SUCTION POOLE HANDLE (INSTRUMENTS) ×3
SUT PDS AB 1 TP1 96 (SUTURE) ×6 IMPLANT
SUT PROLENE 3 0 SH DA (SUTURE) ×3 IMPLANT
SUT SILK 2 0 (SUTURE)
SUT SILK 2-0 18XBRD TIE 12 (SUTURE) IMPLANT
SUT SILK 3-0 (SUTURE) ×3 IMPLANT
SUT VIC AB 3-0 SH 27 (SUTURE) ×6
SUT VIC AB 3-0 SH 27X BRD (SUTURE) ×3 IMPLANT
SYR 20CC LL (SYRINGE) ×3 IMPLANT
SYR BULB IRRIG 60ML STRL (SYRINGE) ×3 IMPLANT
SYSTEM CONTND EXTRCTN KII BLLN (MISCELLANEOUS) IMPLANT
TOWEL OR 17X26 4PK STRL BLUE (TOWEL DISPOSABLE) ×3 IMPLANT
TRAY FOLEY W/METER SILVER 16FR (SET/KITS/TRAYS/PACK) ×3 IMPLANT

## 2017-07-18 NOTE — Progress Notes (Addendum)
Nutrition Follow-up  DOCUMENTATION CODES:   Not applicable  INTERVENTION:  Advance to goal regimen of Clinimix E 5/15 at 75 mL/hr + 20% ILE at 20 mL/hr over 12 hours. Provides 1758 kcal, 90 grams of protein, 2040 mL fluid daily (1800 mL from Clinimix, 240 mL from lipids).  Provide adult MVI and trace elements as daily TPN additives. Also provide thiamine 100 mg daily for 3 days in TPN as patient is experiencing refeeding syndrome.  Recommend removing D5 from LR and providing LR at 50 mL/hr for total fluid volume of 125 mL/hr including Clinimix.  Monitor magnesium, potassium, and phosphorus daily for at least 3 days, MD/pharmacy to replete as needed, as pt is at risk for refeeding syndrome.  Continue daily weights to help monitor volume status with TPN. Patient's current weight is +6.4 kg from admission weight.  Patient will benefit from education on ostomy nutrition therapy once diet is able to start advancing.  NUTRITION DIAGNOSIS:   Increased nutrient needs related to acute illness, wound healing(abscess on sigmoid colon) as evidenced by estimated needs.  Ongoing.  GOAL:   Patient will meet greater than or equal to 90% of their needs  Not met - addressing with TPN.  MONITOR:   PO intake, Diet advancement, Supplement acceptance, Labs, I & O's, Weight trends  REASON FOR ASSESSMENT:   Malnutrition Screening Tool, Consult New TPN/TNA  ASSESSMENT:   77 y.o. female with a past medical history of asthma, arthritis, diabetes, hypertension, presents to the emergency department for left lower quadrant abdominal pain. States she has lost weight due to decreased appetite over the past 10 days as well.  Not drinking or eating much. CT appears to show a perforated sigmoid colon with 9 x 12 abscess. Likely diverticular in origin.   -Patient s/p CT-guided drainage of left peri-colonic abscess on 4/19. -Patient down in OR for colectomy with ostomy creation/Hartmann procedure (RD will  monitor for operative note later to see if patient has colostomy or ileostomy).  Unable to meet with patient as she is down in OR this morning. Will increase protein needs as patient is having surgery today and will have increased needs for healing.  IV Access: triple lumen PICC placed 07/17/2017; ECG was used to confirm PICC terminates at [facility]  TPN: pt was started on Clinimix 5/15 with no electrolytes at 40 mL/hr last night  Medications reviewed and include: Novolog 0-15 units Q4hrs (received 12 units since start of TPN last night), D5-LR at 125 mL/hr (150 grams dextrose, 510 kcal daily), Zosyn.  Labs reviewed: CBG 150-214, Potassium 3.3 (decrease from 4.2 on 4/20), Creatinine 1.32, Phosphorus 2.2 (decrease from 3 on 4/20), Magnesium 1.3 (decrease from 1.7 on 4/20), Triglycerides 59.  I/O: 5 occurrences unmeasured UOP yesterday; 98 mL output from LLQ drain; 10 occurrences unmeasured stool output yesterday  Weight trend: 97.4 kg on 4/21; + 6.4 kg from admission  Discussed with RN and pharmacy.  Diet Order:  Diet clear liquid Room service appropriate? Yes; Fluid consistency: Thin Diet NPO time specified TPN (CLINIMIX) Adult without lytes  EDUCATION NEEDS:   Education needs have been addressed  Skin:  Skin Assessment: Reviewed RN Assessment  Last BM:  07/15/17(per nurse)  Height:   Ht Readings from Last 1 Encounters:  07/17/17 5' 5"  (1.651 m)    Weight:   Wt Readings from Last 1 Encounters:  07/18/17 214 lb 11.2 oz (97.4 kg)    Ideal Body Weight:  62.5 kg  BMI:  Body mass index  is 35.73 kg/m.  Estimated Nutritional Needs:   Kcal:  1700-2000 kcals (MSJ ABW x 1.2-1.4)  Protein:  81-94 grams (IBW x 1.3-1.5)  Fluid:  <1.7 L/day  Willey Blade, MS, RD, LDN Office: [phone] Pager: [phone] After Hours/Weekend Pager: [phone]

## 2017-07-18 NOTE — Anesthesia Procedure Notes (Signed)
Procedure Name: Intubation Date/Time: 07/18/2017 8:14 AM Performed by: Lendon Colonel, CRNA Pre-anesthesia Checklist: Patient identified, Patient being monitored, Timeout performed, Emergency Drugs available and Suction available Patient Re-evaluated:Patient Re-evaluated prior to induction Oxygen Delivery Method: Circle system utilized Preoxygenation: Pre-oxygenation with 100% oxygen Induction Type: IV induction Ventilation: Mask ventilation without difficulty Laryngoscope Size: Mac and 3 Grade View: Grade I Tube type: Oral Tube size: 7.0 mm Number of attempts: 1 Airway Equipment and Method: Stylet Placement Confirmation: ETT inserted through vocal cords under direct vision,  positive ETCO2 and breath sounds checked- equal and bilateral Secured at: 20 cm Tube secured with: Tape Dental Injury: Teeth and Oropharynx as per pre-operative assessment

## 2017-07-18 NOTE — Op Note (Addendum)
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)  PROCEDURE(S): 1.) Drainage of LLQ abdominal wall abscess 2.) Takedown of the splenic flexure (cpt: 44139) 3.) Partial colectomy with primary 28 mm Covidien DST EEA stapled colorectal anastomosis (cpt: 35701) 4.) Creation of diverting loop ileostomy (cpt: 77939)  INTRAOPERATIVE FINDINGS: 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 with large partially drained feco-purulent abscess cavity and dense firm fibrotic colon wall without an obvious endoluminal mucosal mass and no intraperitoneal evidence of hepatic, omental, or mesenteric implants/metastases, tension-free hemostatic and patent 28 mm EEA colorectal anastomosis and pink viable and functional diverting loop ileostomy at completion of the procedures  INTRAVENOUS FLUIDS: 2600 mL crystalloid   ESTIMATED BLOOD LOSS: 30 mL   URINE OUTPUT: 350 mL   SPECIMENS: Sigmoid colon   IMPLANTS: None  DRAINS:  Top 41 F Blake drain: pelvis/anastomosis Bottom 6 F Blake drain: LLQ abdominal wall abscess cavity  COMPLICATIONS: None apparent   CONDITION AT END OF PROCEDURE: Hemodynamically stable and extubated   DISPOSITION OF PATIENT: PACU  INDICATIONS FOR PROCEDURE:  Patient is a 77 y.o. female who presented to [facility] ED for evaluation of LLQ abdominal pain, at which time she described poor appetite x 2  months, LLQ pain x 2 weeks, 15 lbs weight loss over past 1.5 weeks, and no prior screening colonoscopy despite her brother having died due to colon cancer when he was 77 years old. CT imaging demonstrated a 9.1 x 6.5 x 12.5cm peri-colonic abscess extending into her LLQ abdominal wall, adjacent to and appearing to track from her sigmoid colon. Image-guided drainage was performed, and antibiotics were administered. Due to concern for colorectal malignancy in the context described above, prompt colectomy with ostomy vs outpatient colonoscopy upon resolution of perforation/infection and likely subsequent colectomy were discussed with the patient and her family. All risks, benefits, and alternatives to both approaches were discussed with the patient and her family, and patient elected to proceed with colectomy and diverting ostomy, for which informed consent was obtained and documented. Patient was accordingly prepped using GoLytely, and TPN was initiated via PICC.  DETAILS OF PROCEDURE: Patient was brought to the operating suite and appropriately identified. General anesthesia was administered along with appropriate pre-operative antibiotics, and endotracheal intubation was performed by anesthetist. In supine position, operative site was prepped and draped in the usual sterile fashion, and following a brief time out, a midline incision was made from the Left side of the umbilicus inferiorly to just above the pubis using a #10 blade scalpel and extended deep through subcutaneous tissues until fascia was visualized and exposed along the linea alba midline between the rectus abdominal muscles. Fascia was then divided along the length of the incision, and upon dissecting through preperitoneal fat, the peritoneum was entered and opened likewise along the length of the incision, taking care to avoid injury to underlying non-dilated bowel.  Omni self-retaining retractor was placed and used for exposure throughout the  procedure. The patient's abdominal cavity was explored, and no visible nor palpable evidence of hepatic, omental, or peritoneal metastases were appreciated. The  small bowel was retracted superiorly and to the Right, facilitating visualization of the descending and sigmoid colon, the latter of which was found to be densely adherent to the LLQ abdominal wall. The descending colon was then retracted medially. Using a combination of electrocautery and blunt dissection, the colon was freed from its lateral peritoneal attachments, distally approaching the rectosigmoid junction and proximally towards the splenic flexure except for the colocutaneous fistula, which was initially left undisturbed. During this dissection, care was taken to avoid injury to the ureters, the Left ureter of which was identified. A proximal transection point was selected, stapled, and divided using a GIA-75 linear cutting stapler. Peritoneum was then scored along the mesentary with electrocautery, and the vessels were cauterized, sealed, and cut using the Ligasure.   The colocutaneous fistula was then disrupted and both sides promptly controlled with minimal spillage, after which the abscess cavity was suctioned in its entirety. Freeing of the specimen from its peritoneal attachments was then continued until a Contour blue load linear cutting stapler was safely able to be advanced across the rectosigmoid junction, compressed, and fired. The colon specimen was then handed off of the field as specimen for pathology. Takedown of the splenic flexure was necessary to approximate the the two stapled ends of antimesenteric bowel, after which the two ends were confirmed to lay adjacent without tension. The abdominal cavity was copiously irrigated and suctioned. Enterotomy was made in the distal end of the proximal descending colon, through which 28 mm EEA anvil was introduced and secured using a 3-0 Prolene pursestring suture. Sequential anorectal  dilators were then advanced anterior to the rectal staple line, followed by the EEA stapler itself, which was spiked, connected with the anvil without any tension or intervening fat or small intestine, and used to create the patent hemostatic colorectal anastomosis. The colon proximal to the anastomosis was gently compressed, the pelvis was filled with sterile water, air was insufflated into the rectum and distal colon, and air bubble leak test was performed with no visualization of air bubbles, blood, or enteric material.  19 F Blake drains were then placed transabdominally into the large LLQ abscess cavity and into the pelvis adjacent to both the colorectal anastomosis and the colocutaneous fistula site. Terminal ileum was confirmed to easily reach the anterior abdominal wall, and ileostomy site was selected, at which anterior abdominal wall incision was made and blunt dissection was performed down to fascia, which was divided using electrocautery and blunt dissection, after which tunnel was extended through peritoneum, and selected terminal ileum was brought out through the anterior abdominal wall. An opening was then made through an avascular point along the ileum mesentery and secured using a red rubber catheter and 3-0 nylon suture. Exparel (72-hour release liposomal formulation of bupivicane) was injected into fascia and subcutaneously, and the abdominal wall was re-approximated in layers with #1 looped PDS sutures from the top and bottom of the incision. Intermittent buried interrupted 3-0 Vicryl dermal sutures were placed, and surgical skin staples were used to re-approximate skin. Skin was then cleaned and dried, and a sterile dressing was applied. Ileostomy was then matured, and appropriately sized ileostomy appliance was applied. Patient was then safely able to be extubated, awakened, and transferred to PACU for post-operative monitoring and care.  I was present for all aspects of the above  procedure, and no operative complications were apparent.  Dr. Genevive Bi' assistance was greatly appreciated.

## 2017-07-18 NOTE — Anesthesia Post-op Follow-up Note (Signed)
Anesthesia QCDR form completed.        

## 2017-07-18 NOTE — Progress Notes (Signed)
[facility] NOTE   Pharmacy Consult for TPN Indication: prolonged ileus  Patient Measurements: Height: 5\' 5"  (165.1 cm) Weight: 214 lb 11.2 oz (97.4 kg) IBW/kg (Calculated) : 57 TPN AdjBW (KG): 66.5 Body mass index is 35.73 kg/m.  Assessment: Plan for partial colectomy today with prolonged malnutrition and anticipated ileus  GI: NPO Endo: hx of DM on SSI Insulin requirements in the past 24 hours: 12 units SSI Lytes: K 3.3, Mag 1.3, Phos 2.2, Ca 7.7, Alb 1.9, CorrCa 9.4 Renal: SCr 1.32, CrCl 43.2   TPN Access: PICC line 4/20 TPN start date: 4/20 Nutritional Goals (per RD recommendation on 4/21): 1758 kcal, 90 grams of protein, 2040 mL fluid daily (1800 mL from Clinimix, 240 mL from lipids)  Goal TPN rate is 75 ml/hr   Current Nutrition: TPN Clinimix 5/15 w/o electrolytes at 40 ml/hr  Plan:  Increase TPN to goal regimen of Clinimix E 5/15 at 75 mL/hr with MVI and trace elements + 20% ILE at 20 mL/hr over 12 hours Will also add thiamine 100 mg to TPN (x3 days per dietician recommendation).  Will order mag sulfate 4 g IV x1 KPhos 15 mmol IV x1 (will provide ~22 mEq of potassium)  Continue SSI moderate scale q4h and adjust as needed  D5LR currently running at 125 ml/hr, will change fluids to LR at 50 ml/hr when TPN starts for total rate of 125 ml/hr  Daily weights Strict I/O  F/u BG and electrolytes in AM  Rayna Sexton L 07/18/2017,10:01 AM

## 2017-07-18 NOTE — Anesthesia Postprocedure Evaluation (Signed)
Anesthesia Post Note  Patient: Brenda Gregory  Procedure(s) Performed: COLECTOMY WITH loop ileostomy (Left )  Patient location during evaluation: PACU Anesthesia Type: General Level of consciousness: awake and alert Pain management: pain level controlled Vital Signs Assessment: post-procedure vital signs reviewed and stable Respiratory status: spontaneous breathing, nonlabored ventilation, respiratory function stable and patient connected to nasal cannula oxygen Cardiovascular status: blood pressure returned to baseline and stable Postop Assessment: no apparent nausea or vomiting Anesthetic complications: no     Last Vitals:  Vitals:   07/18/17 1422 07/18/17 1508  BP: (!) 141/63 132/66  Pulse: 64 63  Resp: 15 20  Temp: 36.9 C   SpO2: 96% 97%    Last Pain:  Vitals:   07/18/17 1508  TempSrc:   PainSc: [place] S

## 2017-07-18 NOTE — Transfer of Care (Signed)
Immediate Anesthesia Transfer of Care Note  Patient: Brenda Gregory  Procedure(s) Performed: COLECTOMY WITH loop ileostomy (Left )  Patient Location: PACU  Anesthesia Type:General  Level of Consciousness: sedated and patient cooperative  Airway & Oxygen Therapy: Patient Spontanous Breathing and Patient connected to face mask oxygen  Post-op Assessment: Report given to RN and Post -op Vital signs reviewed and stable  Post vital signs: Reviewed and stable  Last Vitals:  Vitals Value Taken Time  BP 120/62 07/18/2017 12:50 PM  Temp    Pulse 64 07/18/2017 12:50 PM  Resp 19 07/18/2017 12:50 PM  SpO2 100 % 07/18/2017 12:50 PM  Vitals shown include unvalidated device data.  Last Pain:  Vitals:   07/18/17 0709  TempSrc:   PainSc: 0-No pain         Complications: No apparent anesthesia complications

## 2017-07-18 NOTE — Progress Notes (Signed)
SURGICAL POST-OPERATIVE NOTE   Patient seen and examined, reports only mild peri-incisional pain, denies N/V, fever/chills, CP, or SOB.   Review of Systems:  Constitutional: denies fever, chills  HEENT: denies cough or congestion  Respiratory: denies any shortness of breath  Cardiovascular: denies chest pain or palpitations  Gastrointestinal: abdominal pain, nausea, and emesis as per interval history  Musculoskeletal: denies pain, decreased motor or sensation  Neurological: denies HA or vision/hearing changes   Vital signs  Vitals:   07/18/17 1353 07/18/17 1422 07/18/17 1508 07/18/17 1554  BP: 126/66 (!) 141/63 132/66 (!) 149/70  Pulse: 62 64 63 68  Resp: 14 15 20    Temp: 98.3 F (36.8 C) 98.4 F (36.9 C)  97.8 F (36.6 C)  TempSrc:      SpO2: 97% 96% 97% 100%  Weight:      Height:           Height: 5\' 5"  (165.1 cm)  Weight: 214 lb 11.2 oz (97.4 kg) BMI (Calculated): 35.73   Intake/Output:  Total I/O In: 1700 [I.V.:2990; Other:110; IV Piggyback:100] Out: 315 [Urine:285; Blood:30]   Physical Exam:  General: Alert, cooperative and no distress  Neuro: CNII - XII grossly intact and symmetric, no motor or sensory deficits  HENT: Normocephalic, without obvious abnormality  Eyes: PERRL, EOM's grossly intact and symmetric  Lungs: Breathing non-labored at rest  Cardiovascular: Regular rate and sinus rhythm  Gastrointestinal: Abdomen soft and non-distended with mild peri-incisional tenderness to palpation with incision well-approximated, dressing c/d/i without surrounding erythema or drainage, Right-sided ileostomy pink and viable with Left-side drains x2 well-secured without any drainage thus far Extremities: UE and LE FROM, NT, no edema or wounds   Assessment/Plan:  77 y.o. 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    - pain control as needed  - okay with ice chips/sips of water prn today  - monitor abdominal exam and ostomy/bowel function  - medical management of comorbidities as per medical team   - anticipate advance to clear liquids diet and home meds tomorrow  - continue IV antibiotics and complete course of oral antibiotics at discharge  - DVT prophylaxis with ambulation encouraged  - f/u pending surgical pathology  All of the above findings and recommendations were discussed with the patient and her family, and all of their questions were answered to their expressed satisfaction.  -- Marilynne Drivers [name], MD, [place]: [facility] - Partnering for exceptional care. Office: [phone]

## 2017-07-18 NOTE — Anesthesia Preprocedure Evaluation (Signed)
Anesthesia Evaluation  Patient identified by MRN, date of birth, ID band Patient awake    Reviewed: Allergy & Precautions, NPO status , Patient's Chart, lab work & pertinent test results, reviewed documented beta blocker date and time   Airway Mallampati: III  TM Distance: >3 FB     Dental  (+) Chipped   Pulmonary asthma , former smoker,           Cardiovascular hypertension, Pt. on medications      Neuro/Psych    GI/Hepatic   Endo/Other  diabetes, Type 2  Renal/GU Renal disease     Musculoskeletal  (+) Arthritis ,   Abdominal   Peds  Hematology   Anesthesia Other Findings Obese. Gout.  Reproductive/Obstetrics                             Anesthesia Physical Anesthesia Plan  ASA: III  Anesthesia Plan: General   Post-op Pain Management:    Induction: Intravenous  PONV Risk Score and Plan:   Airway Management Planned: Oral ETT  Additional Equipment:   Intra-op Plan:   Post-operative Plan:   Informed Consent: I have reviewed the patients History and Physical, chart, labs and discussed the procedure including the risks, benefits and alternatives for the proposed anesthesia with the patient or authorized representative who has indicated his/her understanding and acceptance.     Plan Discussed with: CRNA  Anesthesia Plan Comments:         Anesthesia Quick Evaluation

## 2017-07-18 NOTE — Progress Notes (Signed)
[facility] [place] NAME: Brenda Gregory    MR#:  [id number]  DATE OF BIRTH:  1940-04-19  SUBJECTIVE:   Patient is status post Drainage of LLQ abdominal wall abscess and Partial colectomy today.  She has no complaints except mild abdominal pain. REVIEW OF SYSTEMS:    Review of Systems  Constitutional: Negative for chills and fever.  HENT: Negative for congestion and tinnitus.   Eyes: Negative for blurred vision and double vision.  Respiratory: Negative for cough, shortness of breath and wheezing.   Cardiovascular: Negative for chest pain, orthopnea and PND.  Gastrointestinal: Positive for abdominal pain. Negative for diarrhea, nausea and vomiting.  Genitourinary: Negative for dysuria and hematuria.  Neurological: Negative for dizziness, sensory change and focal weakness.  All other systems reviewed and are negative.   DRUG ALLERGIES:  No Known Allergies  VITALS:  Blood pressure (!) 149/70, pulse 68, temperature 97.8 F (36.6 C), resp. rate 20, height 5\' 5"  (1.651 m), weight 214 lb 11.2 oz (97.4 kg), SpO2 100 %.  PHYSICAL EXAMINATION:   Physical Exam  GENERAL:  77 y.o.-year-old patient siting up in chair in no acute distress.  Obesity. EYES: Pupils equal, round, reactive to light and accommodation. No scleral icterus. Extraocular muscles intact.  HEENT: Head atraumatic, normocephalic. Oropharynx and nasopharynx clear.  NECK:  Supple, no jugular venous distention. No thyroid enlargement, no tenderness.  LUNGS: Normal breath sounds bilaterally, no wheezing, rales, rhonchi. No use of accessory muscles of respiration.  CARDIOVASCULAR: S1, S2 normal. No murmurs, rubs, or gallops.  ABDOMEN: Soft, abdominal wall in dressing and drainage tube in situ.  No Bowel sounds present. No organomegaly or mass.  EXTREMITIES: No cyanosis, clubbing or edema b/l.    NEUROLOGIC: Cranial nerves II through XII are intact. No focal Motor or sensory deficits  b/l.   PSYCHIATRIC: The patient is alert and oriented x 3.  SKIN: No obvious rash, lesion, or ulcer.    LABORATORY PANEL:   CBC Recent Labs  Lab 07/18/17 0713  WBC 7.4  HGB 10.7*  HCT 32.3*  PLT 323   ------------------------------------------------------------------------------------------------------------------  Chemistries  Recent Labs  Lab 07/18/17 0713  NA 137  K 3.3*  CL 105  CO2 26  GLUCOSE 151*  BUN 17  CREATININE 1.32*  CALCIUM 7.7*  MG 1.3*  AST 33  ALT 29  ALKPHOS 119  BILITOT 0.7   ------------------------------------------------------------------------------------------------------------------  Cardiac Enzymes No results for input(s): TROPONINI in the last 168 hours. ------------------------------------------------------------------------------------------------------------------  RADIOLOGY:  [place] Ekg Site Rite  Result Date: 07/17/2017 If Site Rite image not attached, placement could not be confirmed due to current cardiac rhythm.    ASSESSMENT AND PLAN:   77 year old female with past medical history of attention, gout, diabetes, osteoarthritis, asthma who presents to the hospital due to abdominal pain and noted to have a pelvic wall abscess with connection to the sigmoid colon.  1.  Peri- colonic abscess-this is the cause of patient's worsening abdominal pain.  Patient noted to have a large abscess on the CT scan of the abdomen pelvis on admission.  Continue broad-spectrum IV antibiotics with Zosyn for now. - Patient is status post CT-guided drainage of the peri-colonic abscess.  Discussed with general surgery today and they think this is likely underlying colon cancer as a CT scan did not show any evidence of diverticulitis.  GI was consulted and they do not recommend endoscopic evaluation given high risk for perforation and worsening of the abscess.  status post Drainage of LLQ abdominal wall abscess and Partial colectomy today. Start clear  liquid diet tomorrow per Dr. [name].  2.  Diabetes type 2 without complications- continue sliding scale insulin, follow blood sugars which are stable so far.  3.  Essential hypertension-continue lisinopril.    4.  hx of gout-no acute attack.  Hold allopurinol for now.  5. AKI - due to dehydration and poor PO intake.  - cont. IV fluids and Cr. Improving.  Hypokalemia and hypomagnesemia, given supplement and follow-up level.  I discussed with Dr. [name]. All the records are reviewed and case discussed with Care Management/Social Worker. Management plans discussed with the patient, her daughter and they are in agreement.  CODE STATUS: Full code  DVT Prophylaxis: Lovenox  TOTAL TIME TAKING CARE OF THIS PATIENT: 30 minutes.   POSSIBLE D/C unclear, DEPENDING ON CLINICAL CONDITION.   Demetrios Loll M.D on 07/18/2017 at 3:57 PM  Between 7am to 6pm - Pager - [phone]  After 6pm go to [email or web address] - Proofreader  [facility]  Office  [phone]  CC: Primary care physician; Derinda Late, MD

## 2017-07-19 ENCOUNTER — Encounter: Payer: Self-pay | Admitting: Surgery

## 2017-07-19 LAB — TRIGLYCERIDES: Triglycerides: 33 mg/dL (ref <150)

## 2017-07-19 LAB — MAGNESIUM: Magnesium: 1.9 mg/dL (ref 1.7–2.4)

## 2017-07-19 LAB — DIFFERENTIAL
Basophils Absolute: 0 10*3/uL (ref 0–0.1)
Basophils Relative: 0 %
EOS ABS: 0.1 10*3/uL (ref 0–0.7)
EOS PCT: 1 %
LYMPHS PCT: 6 %
Lymphs Abs: 0.6 10*3/uL — ABNORMAL LOW (ref 1.0–3.6)
MONO ABS: 0.7 10*3/uL (ref 0.2–0.9)
Monocytes Relative: 7 %
NEUTROS PCT: 86 %
Neutro Abs: 8.8 10*3/uL — ABNORMAL HIGH (ref 1.4–6.5)

## 2017-07-19 LAB — COMPREHENSIVE METABOLIC PANEL
ALK PHOS: 91 U/L (ref 38–126)
ALT: 22 U/L (ref 14–54)
ANION GAP: 5 (ref 5–15)
AST: 25 U/L (ref 15–41)
Albumin: 1.7 g/dL — ABNORMAL LOW (ref 3.5–5.0)
BILIRUBIN TOTAL: 0.5 mg/dL (ref 0.3–1.2)
BUN: 22 mg/dL — ABNORMAL HIGH (ref 6–20)
CALCIUM: 7.5 mg/dL — AB (ref 8.9–10.3)
CO2: 24 mmol/L (ref 22–32)
CREATININE: 1.19 mg/dL — AB (ref 0.44–1.00)
Chloride: 106 mmol/L (ref 101–111)
GFR calc non Af Amer: 44 mL/min — ABNORMAL LOW (ref >60)
GFR, EST AFRICAN AMERICAN: 51 mL/min — AB (ref >60)
Glucose, Bld: 268 mg/dL — ABNORMAL HIGH (ref 65–99)
Potassium: 3.7 mmol/L (ref 3.5–5.1)
Sodium: 135 mmol/L (ref 135–145)
TOTAL PROTEIN: 5.2 g/dL — AB (ref 6.5–8.1)

## 2017-07-19 LAB — CBC
HEMATOCRIT: 32.4 % — AB (ref 35.0–47.0)
HEMOGLOBIN: 11 g/dL — AB (ref 12.0–16.0)
MCH: 29.7 pg (ref 26.0–34.0)
MCHC: 33.8 g/dL (ref 32.0–36.0)
MCV: 88 fL (ref 80.0–100.0)
Platelets: 354 10*3/uL (ref 150–440)
RBC: 3.69 MIL/uL — AB (ref 3.80–5.20)
RDW: 14.6 % — ABNORMAL HIGH (ref 11.5–14.5)
WBC: 10.2 10*3/uL (ref 3.6–11.0)

## 2017-07-19 LAB — GLUCOSE, CAPILLARY
GLUCOSE-CAPILLARY: 219 mg/dL — AB (ref 65–99)
GLUCOSE-CAPILLARY: 211 mg/dL — AB (ref 65–99)
Glucose-Capillary: 305 mg/dL — ABNORMAL HIGH (ref 65–99)
Glucose-Capillary: 226 mg/dL — ABNORMAL HIGH (ref 65–99)
Glucose-Capillary: 208 mg/dL — ABNORMAL HIGH (ref 65–99)

## 2017-07-19 LAB — PREALBUMIN: PREALBUMIN: 6.8 mg/dL — AB (ref 18–38)

## 2017-07-19 LAB — PHOSPHORUS: Phosphorus: 3.3 mg/dL (ref 2.5–4.6)

## 2017-07-19 MED ORDER — MAGNESIUM SULFATE IN D5W 1-5 GM/100ML-% IV SOLN
1.0000 g | Freq: Once | INTRAVENOUS | Status: AC
  Administered 2017-07-19: 1 g via INTRAVENOUS
  Filled 2017-07-19: qty 100

## 2017-07-19 MED ORDER — FAT EMULSION PLANT BASED 20 % IV EMUL
250.0000 mL | INTRAVENOUS | Status: AC
  Administered 2017-07-19: 250 mL via INTRAVENOUS
  Filled 2017-07-19 (×3): qty 250

## 2017-07-19 MED ORDER — TRACE MINERALS CR-CU-MN-SE-ZN 10-1000-500-60 MCG/ML IV SOLN
INTRAVENOUS | Status: AC
  Administered 2017-07-19: 19:00:00 via INTRAVENOUS
  Filled 2017-07-19: qty 1800

## 2017-07-19 NOTE — Progress Notes (Signed)
Inpatient Diabetes Program Recommendations  AACE/ADA: New Consensus Statement on Inpatient Glycemic Control (2015)  Target Ranges:  Prepandial:   less than 140 mg/dL      Peak postprandial:   less than 180 mg/dL (1-2 hours)      Critically ill patients:  140 - 180 mg/dL  Results for Brenda Gregory, Brenda Gregory (MRN [id number]) as of 07/19/2017 09:43  Ref. Range 07/18/2017 07:43 07/18/2017 13:09 07/18/2017 16:34 07/18/2017 20:41 07/18/2017 23:57 07/19/2017 04:26 07/19/2017 07:39  Glucose-Capillary Latest Ref Range: 65 - 99 mg/dL 150 (H) 159 (H) 180 (H) 280 (H)  Novolog 8 units 301 (H)  Novolog 11 units 305 (H)  Novolog 11 units 226 (H)  Novolog 5 units    Review of Glycemic Control  Diabetes history: DM2 Outpatient Diabetes medications: Metformin 500 mg QAM Current orders for Inpatient glycemic control: Novolog 0-15 units Q4H  Inpatient Diabetes Program Recommendations:  Insulin - TPN: Noted glucose has become more elevated since TPN started. Pharmacy, please consider adding insulin to TPN to help improve glycemic control.  Insulin - TPN Coverage: Adding insulin to the TPN would be the safest option to improve glycemic control so that patient does not have TPN coverage Novolog SQ active insulin if TPN is stopped or held for any reason.  However, if insulin is not added to the TPN, would recommend ordering Novolog 4 units Q4H for TPN coverage (but not ideal and would prefer insulin be added to TPN).  Thanks, Brenda Alderman, RN, MSN, CDE Diabetes Coordinator Inpatient Diabetes Program [phone] (Team Pager from 8am to 5pm)

## 2017-07-19 NOTE — Progress Notes (Signed)
PT Cancellation Note  Patient Details Name: Brenda Gregory MRN: [id number] DOB: 09-13-1940   Cancelled Treatment:    Reason Eval/Treat Not Completed: Patient declined, no reason specified. Order received.  Chart reviewed.  Pt states that she recently received assistance with bed mobility and that she is having increased abdominal pain as a result.  Requested that evaluation be performed tomorrow morning.  Will re-attempt later when time allows.   Roxanne Gates, PT, DPT 07/19/2017, 3:34 PM

## 2017-07-19 NOTE — Progress Notes (Signed)
[facility] at [place] NAME: Brenda Gregory    MR#:  [id number]  DATE OF BIRTH:  [birth year]  SUBJECTIVE:   Patient is status post partial colectomy with colostomy placement, drainage of the left lower quadrant abdominal wall abscess postop day 1 today.  Patient still having some abdominal pain near the surgical site.  Patient has 2 JP drains which are draining serosanguineous fluid, colostomy has some brown liquid.  REVIEW OF SYSTEMS:    Review of Systems  Constitutional: Negative for chills and fever.  HENT: Negative for congestion and tinnitus.   Eyes: Negative for blurred vision and double vision.  Respiratory: Negative for cough, shortness of breath and wheezing.   Cardiovascular: Negative for chest pain, orthopnea and PND.  Gastrointestinal: Positive for abdominal pain. Negative for diarrhea, nausea and vomiting.  Genitourinary: Negative for dysuria and hematuria.  Neurological: Negative for dizziness, sensory change and focal weakness.  All other systems reviewed and are negative.   DRUG ALLERGIES:  No Known Allergies  VITALS:  Blood pressure (!) 143/71, pulse 77, temperature 97.6 F (36.4 C), temperature source Oral, resp. rate 20, height 5\' 5"  (1.651 m), weight 100.4 kg (221 lb 6.4 oz), SpO2 97 %.  PHYSICAL EXAMINATION:   Physical Exam  GENERAL:  76 y.o.-year-old obese patient lying in bed in no acute distress.   EYES: Pupils equal, round, reactive to light and accommodation. No scleral icterus. Extraocular muscles intact.  HEENT: Head atraumatic, normocephalic. Oropharynx and nasopharynx clear.  NECK:  Supple, no jugular venous distention. No thyroid enlargement, no tenderness.  LUNGS: Normal breath sounds bilaterally, no wheezing, rales, rhonchi. No use of accessory muscles of respiration.  CARDIOVASCULAR: S1, S2 normal. No murmurs, rubs, or gallops.  ABDOMEN: Soft, mid abdomen dressing in place, left-sided colostomy in place  with brown liquid noted, left-sided 2 JP drains with serosanguineous fluid draining.   EXTREMITIES: No cyanosis, clubbing or edema b/l.    NEUROLOGIC: Cranial nerves II through XII are intact. No focal Motor or sensory deficits b/l.  PSYCHIATRIC: The patient is alert and oriented x 3.  SKIN: No obvious rash, lesion, or ulcer.    LABORATORY PANEL:   CBC Recent Labs  Lab 07/19/17 0629  WBC 10.2  HGB 11.0*  HCT 32.4*  PLT 354   ------------------------------------------------------------------------------------------------------------------  Chemistries  Recent Labs  Lab 07/19/17 0629  NA 135  K 3.7  CL 106  CO2 24  GLUCOSE 268*  BUN 22*  CREATININE 1.19*  CALCIUM 7.5*  MG 1.9  AST 25  ALT 22  ALKPHOS 91  BILITOT 0.5   ------------------------------------------------------------------------------------------------------------------  Cardiac Enzymes No results for input(s): TROPONINI in the last 168 hours. ------------------------------------------------------------------------------------------------------------------  RADIOLOGY:  No results found.   ASSESSMENT AND PLAN:   77 year old female with past medical history of attention, gout, diabetes, osteoarthritis, asthma who presents to the hospital due to abdominal pain and noted to have a pelvic wall abscess with connection to the sigmoid colon.  1.  Peri- colonic abscess-this is the cause of patient's worsening abdominal pain.  Patient noted to have a large abscess on the CT scan of the abdomen pelvis on admission.  Continue broad-spectrum IV antibiotics with Zosyn for now. - Patient is status post CT-guided drainage of the peri-colonic abscess.  S/p partial colectomy with colostomy placement, drainage of the left lower quadrant abdominal wall abscess - cont. Pain control and further care per surgery.  - cont. TPN for now.  May start clear liquids  but will defer to surgery.   2.  Diabetes type 2 without  complications- continue sliding scale insulin and Insulin in TPN.  Follow BS.   3.  Essential hypertension-continue lisinopril.    4.  hx of gout-no acute attack.  Hold allopurinol for now.  5. AKI - due to dehydration and poor PO intake.  - cont. TPN and will follow BUN/Cr.  Cr. Stable and will cont. To monitor.  - cont. IV fluids and Cr. Improving.   All the records are reviewed and case discussed with Care Management/Social Worker. Management plans discussed with the patient, her daughter and they are in agreement.  CODE STATUS: Full code  DVT Prophylaxis: Lovenox  TOTAL TIME TAKING CARE OF THIS PATIENT: 30 minutes.   POSSIBLE D/C unclear, DEPENDING ON CLINICAL CONDITION.   Henreitta Leber M.D on 07/19/2017 at 1:29 PM  Between 7am to 6pm - Pager - [phone]  After 6pm go to [email or web address] - Proofreader  [facility]  Office  [phone]  CC: Primary care physician; Derinda Late, MD

## 2017-07-19 NOTE — Progress Notes (Signed)
[place] NOTE   Pharmacy Consult for TPN Indication: prolonged ileus  Patient Measurements: Height: 5\' 5"  (165.1 cm) Weight: 221 lb 6.4 oz (100.4 kg) IBW/kg (Calculated) : 57 TPN AdjBW (KG): 66.5 Body mass index is 36.84 kg/m.  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 Patient has been advanced to CLD today.   GI: [facility]D Endo: hx of DM on SSI Insulin requirements in the past 24 hours: 40 units SSI Will add Insulin 20 units to bag of TPN Lytes: K 3.7, Mag 1.9, Phos 3.3, Ca 7.5, Alb 1.7, CorrCa 9.34 Renal: SCr 1.19, CrCl 48.7  TPN Access: PICC line 4/20 TPN start date: 4/20 Nutritional Goals (per RD recommendation on 4/21): 1758 kcal, 90 grams of protein, 2040 mL fluid daily (1800 mL from Clinimix, 240 mL from lipids)  Goal TPN rate is 75 ml/hr   Current Nutrition: TPN Clinimix 5/15 w/ electrolytes at 75 ml/hr  Plan:  Continue TPN at goal regimen of Clinimix E 5/15 at 75 mL/hr with MVI and trace elements + 20% ILE at 20 mL/hr over 12 hours Will also add thiamine 100 mg to TPN (x3 days per dietician recommendation). Will add Regular Insulin 20 units to TPN  Will order mag sulfate 1 g IV x1  Continue SSI moderate scale q4h and adjust as needed  LR currently running at 50 ml/hr for total fluid rate of 125 ml/hr  Daily weights Strict I/O  F/u BG and electrolytes in AM  Paulina Fusi, PharmD, BCPS 07/19/2017 1:38 PM

## 2017-07-19 NOTE — Care Management Important Message (Signed)
Copy of signed IM left in patient's room.    

## 2017-07-19 NOTE — Progress Notes (Signed)
   Subjective/Chief Complaint: Pain improving.  Tolerating clears, refused PT today   Objective: Vital signs in last 24 hours: Temp:  [97.6 F (36.4 C)-98.5 F (36.9 C)] 97.8 F (36.6 C) (04/22 1330) Pulse Rate:  [73-86] 85 (04/22 1330) Resp:  [20] 20 (04/22 0430) BP: (132-143)/(53-71) 139/64 (04/22 1330) SpO2:  [96 %-99 %] 97 % (04/22 1330) Weight:  [221 lb 6.4 oz (100.4 kg)] 221 lb 6.4 oz (100.4 kg) (04/22 0654) Last BM Date: 07/15/17  Intake/Output from previous day: 04/21 0701 - 04/22 0700 In: 5100 [I.V.:4825; IV Piggyback:130] Out: 785 [Urine:685; Drains:70; Blood:30] Intake/Output this shift: Total I/O In: 160 [Other:160] Out: 400 [Urine:400]  GI: Appropriately TTP , drains serosanguinous, ileostomy pink but edematous.    Lab Results:  Recent Labs    07/18/17 0713 07/19/17 0629  WBC 7.4 10.2  HGB 10.7* 11.0*  HCT 32.3* 32.4*  PLT 323 354   BMET Recent Labs    07/18/17 0713 07/19/17 0629  NA 137 135  K 3.3* 3.7  CL 105 106  CO2 26 24  GLUCOSE 151* 268*  BUN 17 22*  CREATININE 1.32* 1.19*  CALCIUM 7.7* 7.5*   PT/INR No results for input(s): LABPROT, INR in the last 72 hours. ABG No results for input(s): PHART, HCO3 in the last 72 hours.  Invalid input(s): PCO2, PO2  Studies/Results: No results found.  Anti-infectives: Anti-infectives (From admission, onward)   Start     Dose/Rate Route Frequency Ordered Stop   07/15/17 2300  piperacillin-tazobactam (ZOSYN) IVPB 3.375 g     3.375 g 12.5 mL/hr over 240 Minutes Intravenous Every 8 hours 07/15/17 1817     07/15/17 1630  piperacillin-tazobactam (ZOSYN) IVPB 3.375 g     3.375 g 100 mL/hr over 30 Minutes Intravenous  Once 07/15/17 1625 07/15/17 1736      Assessment/Plan: s/p Procedure(s): COLECTOMY WITH loop ileostomy (Left) d/c foley,  Dc abx in 3 days Await ostomy function then DC TPN PT/OT DC drains soon  LOS: 4 days    Andres Labrum 07/19/2017

## 2017-07-20 LAB — CBC
HCT: 30.3 % — ABNORMAL LOW (ref 35.0–47.0)
HEMOGLOBIN: 10.3 g/dL — AB (ref 12.0–16.0)
MCH: 29.9 pg (ref 26.0–34.0)
MCHC: 34 g/dL (ref 32.0–36.0)
MCV: 87.8 fL (ref 80.0–100.0)
Platelets: 337 10*3/uL (ref 150–440)
RBC: 3.46 MIL/uL — AB (ref 3.80–5.20)
RDW: 14.7 % — ABNORMAL HIGH (ref 11.5–14.5)
WBC: 12.2 10*3/uL — ABNORMAL HIGH (ref 3.6–11.0)

## 2017-07-20 LAB — BASIC METABOLIC PANEL
Anion gap: 4 — ABNORMAL LOW (ref 5–15)
BUN: 29 mg/dL — ABNORMAL HIGH (ref 6–20)
CHLORIDE: 106 mmol/L (ref 101–111)
CO2: 25 mmol/L (ref 22–32)
Calcium: 7.6 mg/dL — ABNORMAL LOW (ref 8.9–10.3)
Creatinine, Ser: 1.32 mg/dL — ABNORMAL HIGH (ref 0.44–1.00)
GFR calc non Af Amer: 39 mL/min — ABNORMAL LOW (ref >60)
GFR, EST AFRICAN AMERICAN: 45 mL/min — AB (ref >60)
Glucose, Bld: 181 mg/dL — ABNORMAL HIGH (ref 65–99)
Potassium: 4.1 mmol/L (ref 3.5–5.1)
Sodium: 135 mmol/L (ref 135–145)

## 2017-07-20 LAB — GLUCOSE, CAPILLARY
GLUCOSE-CAPILLARY: 185 mg/dL — AB (ref 65–99)
GLUCOSE-CAPILLARY: 172 mg/dL — AB (ref 65–99)
GLUCOSE-CAPILLARY: 170 mg/dL — AB (ref 65–99)
GLUCOSE-CAPILLARY: 165 mg/dL — AB (ref 65–99)
Glucose-Capillary: 198 mg/dL — ABNORMAL HIGH (ref 65–99)
Glucose-Capillary: 171 mg/dL — ABNORMAL HIGH (ref 65–99)

## 2017-07-20 LAB — PHOSPHORUS: PHOSPHORUS: 3.1 mg/dL (ref 2.5–4.6)

## 2017-07-20 LAB — MAGNESIUM: Magnesium: 1.9 mg/dL (ref 1.7–2.4)

## 2017-07-20 MED ORDER — FAT EMULSION PLANT BASED 20 % IV EMUL
250.0000 mL | INTRAVENOUS | Status: AC
  Administered 2017-07-20: 250 mL via INTRAVENOUS
  Filled 2017-07-20: qty 250

## 2017-07-20 MED ORDER — TRACE MINERALS CR-CU-MN-SE-ZN 10-1000-500-60 MCG/ML IV SOLN
INTRAVENOUS | Status: AC
  Administered 2017-07-20: 18:00:00 via INTRAVENOUS
  Filled 2017-07-20: qty 1800

## 2017-07-20 NOTE — Progress Notes (Signed)
[place] NOTE   Pharmacy Consult for TPN Indication: prolonged ileus  Patient Measurements: Height: 5\' 5"  (165.1 cm) Weight: 225 lb 12 oz (102.4 kg) IBW/kg (Calculated) : 57 TPN AdjBW (KG): 66.5 Body mass index is 37.57 kg/m.  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 Patient has been advanced to CLD today.   GI: [facility]D Endo: hx of DM on SSI Insulin requirements in the past 24 hours: 26 units SSI Will increase Insulin from 20 units to 24 units to bag of TPN Lytes: K 4.1, Mag 1.9, Phos 3.1, Ca 7.6, Alb 1.7, CorrCa 9.34 Renal: SCr 1.32, CrCl 44.4  TPN Access: PICC line 4/20 TPN start date: 4/20 Nutritional Goals (per RD recommendation on 4/21): 1758 kcal, 90 grams of protein, 2040 mL fluid daily (1800 mL from Clinimix, 240 mL from lipids)  Goal TPN rate is 75 ml/hr   Current Nutrition: TPN Clinimix 5/15 w/ electrolytes at 75 ml/hr  Plan:  Continue TPN at goal regimen of Clinimix E 5/15 at 75 mL/hr with MVI and trace elements + 20% ILE at 20 mL/hr over 12 hours  Will increase Regular Insulin from 20 units to 24 units in TPN bag  Continue SSI moderate scale q4h and adjust as needed  LR currently running at 50 ml/hr for total fluid rate of 125 ml/hr  Daily weights Strict I/O  F/u BG and electrolytes in AM  *Per Dietician- if patient tolerates clear liquids/progresses to full liquids then may consider adjustment of TPN tomorrow?  Chinita Greenland PharmD Clinical Pharmacist 07/20/2017

## 2017-07-20 NOTE — Care Management (Signed)
Patient with new ostomy.  Patient has been seen by [facility] nurse.  PT and OT currently recommending SNF.  CSW notified.  RNCM following for needs

## 2017-07-20 NOTE — Progress Notes (Signed)
[place] at [place] NAME: Brenda Gregory    MR#:  [id number]  DATE OF BIRTH:  18-Jul-1940  SUBJECTIVE:   Patient is status post partial colectomy with colostomy placement, drainage of the left lower quadrant abdominal wall abscess postop day 2 today.  Abdomen is soft, still has no ostomy output.  Pain is improved since yesterday.  Tolerating clear liquid diet.  Remains on TPN.  REVIEW OF SYSTEMS:    Review of Systems  Constitutional: Negative for chills and fever.  HENT: Negative for congestion and tinnitus.   Eyes: Negative for blurred vision and double vision.  Respiratory: Negative for cough, shortness of breath and wheezing.   Cardiovascular: Negative for chest pain, orthopnea and PND.  Gastrointestinal: Positive for abdominal pain. Negative for diarrhea, nausea and vomiting.  Genitourinary: Negative for dysuria and hematuria.  Neurological: Negative for dizziness, sensory change and focal weakness.  All other systems reviewed and are negative.   DRUG ALLERGIES:  No Known Allergies  VITALS:  Blood pressure (!) 162/79, pulse 79, temperature 98.3 F (36.8 C), temperature source Oral, resp. rate 19, height 5\' 5"  (1.651 m), weight 102.4 kg (225 lb 12 oz), SpO2 95 %.  PHYSICAL EXAMINATION:   Physical Exam  GENERAL:  77 y.o.-year-old obese patient lying in bed in no acute distress.   EYES: Pupils equal, round, reactive to light and accommodation. No scleral icterus. Extraocular muscles intact.  HEENT: Head atraumatic, normocephalic. Oropharynx and nasopharynx clear.  NECK:  Supple, no jugular venous distention. No thyroid enlargement, no tenderness.  LUNGS: Normal breath sounds bilaterally, no wheezing, rales, rhonchi. No use of accessory muscles of respiration.  CARDIOVASCULAR: S1, S2 normal. No murmurs, rubs, or gallops.  ABDOMEN: Soft, mid abdomen dressing in place, right-sided colostomy in place with brown liquid noted, left-sided  2 JP drains with serosanguineous fluid draining.   EXTREMITIES: No cyanosis, clubbing or edema b/l.    NEUROLOGIC: Cranial nerves II through XII are intact. No focal Motor or sensory deficits b/l. Globally weak  PSYCHIATRIC: The patient is alert and oriented x 3.  SKIN: No obvious rash, lesion, or ulcer.    LABORATORY PANEL:   CBC Recent Labs  Lab 07/20/17 0509  WBC 12.2*  HGB 10.3*  HCT 30.3*  PLT 337   ------------------------------------------------------------------------------------------------------------------  Chemistries  Recent Labs  Lab 07/19/17 0629 07/20/17 0509  NA 135 135  K 3.7 4.1  CL 106 106  CO2 24 25  GLUCOSE 268* 181*  BUN 22* 29*  CREATININE 1.19* 1.32*  CALCIUM 7.5* 7.6*  MG 1.9 1.9  AST 25  --   ALT 22  --   ALKPHOS 91  --   BILITOT 0.5  --    ------------------------------------------------------------------------------------------------------------------  Cardiac Enzymes No results for input(s): TROPONINI in the last 168 hours. ------------------------------------------------------------------------------------------------------------------  RADIOLOGY:  No results found.   ASSESSMENT AND PLAN:   77 year old female with past medical history of attention, gout, diabetes, osteoarthritis, asthma who presents to the hospital due to abdominal pain and noted to have a pelvic wall abscess with connection to the sigmoid colon.  1.  Peri- colonic abscess-this is the cause of patient's worsening abdominal pain. Patient is status post CT-guided drainage of the peri-colonic abscess and subsequently S/p partial colectomy with colostomy placement, drainage of the left lower quadrant abdominal wall abscess POD # 2 today.  - cont. Pain control and further care per surgery.  - cont. TPN for now.  Tolerating clear liquids, awaiting  ostomy function to return.  Continue incentive spirometry, remove Foley, further care as per surgery.   2.  Diabetes type 2  without complications- continue sliding scale insulin and Insulin in TPN.  Follow BS.   3.  Essential hypertension-continue lisinopril.    4.  hx of gout-no acute attack.  Hold allopurinol for now.  5. AKI - due to dehydration and poor PO intake.  - cont. TPN and will follow BUN/Cr.  Cr. Stable and will cont. To monitor.   Physical therapy recommending short-term rehab.  All the records are reviewed and case discussed with Care Management/Social Worker. Management plans discussed with the patient, her daughter and they are in agreement.  CODE STATUS: Full code  DVT Prophylaxis: Lovenox  TOTAL TIME TAKING CARE OF THIS PATIENT: 25 minutes.   POSSIBLE D/C unclear, DEPENDING ON CLINICAL CONDITION.   Henreitta Leber M.D on 07/20/2017 at 2:47 PM  Between 7am to 6pm - Pager - [phone]  After 6pm go to [email or web address] - Proofreader  [facility]  Office  [phone]  CC: Primary care physician; Derinda Late, MD

## 2017-07-20 NOTE — Consult Note (Signed)
Explained role of ostomy nurse and creation of stoma. Explained stoma characteristics (budded, flush, color, texture, care) Patient has a budded, pale pink, edematous stoma that measures 1.5 inches. All sutures intact. Demonstrated pouch change (cutting new skin barrier, measuring stoma, cleaning peristomal skin and stoma, use of barrier ring). Education on emptying when 1/3 to 1/2 full and how to empty. Answered patient questions her to expressed satisfaction.  Patient not enrolled in Secure start program.  Patient had very few questions.   Thank you for the consult. Val Riles, RN, MSN, CWOCN, CNS-BC, pager [phone]

## 2017-07-20 NOTE — Evaluation (Signed)
Occupational Therapy Evaluation Patient Details Name: Brenda Gregory MRN: [id number] DOB: [birth year] Today's Date: 07/20/2017    History of Present Illness Pt is a 77 year old female admitted from home s/p colectomy with loop ileostomy (Left).  PMH includes Htn, gout, DM, asthma and arthritis.   Clinical Impression   Pt seen for OT evaluation this date. Prior to hospital admission, pt was independent, working part time at [employer], driving, and living in a 2nd floor apartment with no elevator access.  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  Currently pt is limited in her ability to perform ADL and mobility secondary to impairments in significant pain with any movement (limited mobility this session) and generalized weakness. Reaching overhead with BUE causes pain making UB dressing and bathing difficult as well. Pt educated in home/routines modifications and AE to improve independence with UB and LB ADL tasks and minimize need to bend over or put undue pressure on abdominal incision site. Pt will benefit from additional training to support recall and carryover of AE/DME use and learned techniques. Recommend TTB for seated shower once home, 3:1 for over commode to improve safety/independence/pain with transfers, and AE to improve independence and comfort with LB ADL. Pt would benefit from skilled OT to address noted impairments and functional limitations (see below for any additional details) including falls prevention, DME/AE for ADL, and health/chronic disease self management for new ostomy bag.  Upon hospital discharge, recommend pt discharge to STR to facilitate safe return home ultimately.     Follow Up Recommendations  SNF    Equipment Recommendations  Tub/shower bench;3 in 1 bedside commode(may require bariatric for improved comfort; reacher, LH sponge, LH shoe horn)    Recommendations for Other Services       Precautions /  Restrictions Precautions Precautions: Fall;Other (comment) Precaution Comments: abdominal incision Restrictions Weight Bearing Restrictions: No      Mobility Bed Mobility     General bed mobility comments: deferred, up in recliner  Transfers       General transfer comment: pt declined 2:2 10/10 abdominal incision pain with any movement    Balance                                          ADL either performed or assessed with clinical judgement   ADL Overall ADL's : Needs assistance/impaired Eating/Feeding: Sitting;Independent   Grooming: Sitting;Minimal assistance Grooming Details (indicate cue type and reason): min assist for significant reaching behind/overhead due to abdominal incision pain Upper Body Bathing: Sitting;Minimal assistance;Moderate assistance   Lower Body Bathing: Maximal assistance;Sit to/from stand   Upper Body Dressing : Sitting;Minimal assistance;Moderate assistance   Lower Body Dressing: Maximal assistance;Sit to/from stand   Toilet Transfer: Stand-pivot;BSC;RW;Minimal assistance                   Vision Baseline Vision/History: Wears glasses Wears Glasses: At all times Patient Visual Report: No change from baseline       Perception     Praxis      Pertinent Vitals/Pain Pain Assessment: 0-10 Pain Score: 4  Pain Location: Incision site at rest, increasing to 10/10 with any movement Pain Descriptors / Indicators: Sharp Pain Intervention(s): Limited activity within patient's tolerance;Monitored during session     Hand Dominance Right   Extremity/Trunk Assessment Upper Extremity Assessment Upper Extremity Assessment:  Generalized weakness(grossly 4-/5 bilaterally, 4/5 grip bilat)   Lower Extremity Assessment Lower Extremity Assessment: Defer to PT evaluation;Generalized weakness   Cervical / Trunk Assessment Cervical / Trunk Assessment: Normal   Communication Communication Communication: No difficulties    Cognition Arousal/Alertness: Awake/alert Behavior During Therapy: WFL for tasks assessed/performed Overall Cognitive Status: Within Functional Limits for tasks assessed                                     General Comments       Exercises  Other Exercises Other Exercises: Pt educated in home/routines modifications and AE to improve independence with UB and LB ADL tasks and minimize need to bend over or put undue pressure on abdominal incision site. Pt will benefit from additional training to support recall and carryover.    Shoulder Instructions      Home Living Family/patient expects to be discharged to:: Private residence Living Arrangements: Children(daughter) Available Help at Discharge: Family(dtr recently lost job but is looking for work) Type of Home: Apartment Home Access: Stairs to enter Technical brewer of Steps: 14 Entrance Stairs-Rails: Can reach both [place]: One level     Bathroom Shower/Tub: Teacher, early years/pre: Standard Bathroom Accessibility: Yes   Home Equipment: [place] - single point;Walker - 2 wheels          Prior Functioning/Environment Level of Independence: Independent        Comments: Was working part time at [employer], indep with ADL, driving. No falls history        OT Problem List: Decreased strength;Decreased knowledge of use of DME or AE;Impaired UE functional use;Pain      OT Treatment/Interventions: Self-care/ADL training;Balance training;Therapeutic exercise;Therapeutic activities;DME and/or AE instruction;Patient/family education    OT Goals(Current goals can be found in the care plan section) Acute Rehab OT Goals Patient Stated Goal: To return home eventually when she gets stronger. OT Goal Formulation: With patient Time For Goal Achievement: 08/03/17 Potential to Achieve Goals: Good ADL Goals Pt Will Perform Upper Body Dressing: with modified independence;sitting(with  abdominal/incision pain <5/10) Pt Will Perform Lower Body Dressing: with min assist;with adaptive equipment;sit to/from stand(with abdominal/incision pain <5/10) Pt Will Transfer to Toilet: with min guard assist;ambulating;bedside commode(with abdominal/incision pain <5/10, LRAD for amb, BSC) Additional ADL Goal #1: Pt will verbalize wound care instructions and how to independently assist caregiver in ostomy care.  OT Frequency: Min 1X/week   Barriers to D/C: Inaccessible home environment          Co-evaluation              AM-PAC PT "6 Clicks" Daily Activity     Outcome Measure Help from another person eating meals?: None Help from another person taking care of personal grooming?: A Little Help from another person toileting, which includes using toliet, bedpan, or urinal?: A Little Help from another person bathing (including washing, rinsing, drying)?: A Lot Help from another person to put on and taking off regular upper body clothing?: A Lot Help from another person to put on and taking off regular lower body clothing?: A Lot 6 Click Score: 16   End of Session    Activity Tolerance: Patient limited by pain Patient left: in chair;with call bell/phone within reach;with chair alarm set  OT Visit Diagnosis: Other abnormalities of gait and mobility (R26.89);Pain;Muscle weakness (generalized) (M62.81) Pain - Right/Left: (abdomen) Pain - part of body: (abdomen)  Time: 2548-6282 OT Time Calculation (min): 14 min Charges:  OT General Charges $OT Visit: 1 Visit OT Evaluation $OT Eval Moderate Complexity: 1 Mod OT Treatments $Self Care/Home Management : 8-22 mins  Jeni Salles, MPH, MS, OTR/L ascom [phone] 07/20/17, 3:55 PM

## 2017-07-20 NOTE — Progress Notes (Signed)
2 Days Post-Op   Subjective/Chief Complaint: OOB to chair today.  No appetite.  Tolerating clears, no ostomy output   Objective: Vital signs in last 24 hours: Temp:  [98.4 F (36.9 C)-98.7 F (37.1 C)] 98.4 F (36.9 C) (04/23 0426) Pulse Rate:  [78-83] 78 (04/23 0426) Resp:  [20] 20 (04/23 0426) BP: (136-160)/(57-77) 160/76 (04/23 1237) SpO2:  [94 %-95 %] 94 % (04/23 0426) Weight:  [225 lb 12 oz (102.4 kg)] 225 lb 12 oz (102.4 kg) (04/23 0426) Last BM Date: 07/18/17  Intake/Output from previous day: 04/22 0701 - 04/23 0700 In: 3473 [I.V.:3258; IV Piggyback:55] Out: 71 [Urine:400; Drains:36; Stool:50] Intake/Output this shift: Total I/O In: 633 [P.O.:240; I.V.:362; IV Piggyback:31] Out: -   GI: Appropriately TTP, Drains serosanguinous, ostomy pink and edematous  Lab Results:  Recent Labs    07/19/17 0629 07/20/17 0509  WBC 10.2 12.2*  HGB 11.0* 10.3*  HCT 32.4* 30.3*  PLT 354 337   BMET Recent Labs    07/19/17 0629 07/20/17 0509  NA 135 135  K 3.7 4.1  CL 106 106  CO2 24 25  GLUCOSE 268* 181*  BUN 22* 29*  CREATININE 1.19* 1.32*  CALCIUM 7.5* 7.6*   PT/INR No results for input(s): LABPROT, INR in the last 72 hours. ABG No results for input(s): PHART, HCO3 in the last 72 hours.  Invalid input(s): PCO2, PO2  Studies/Results: No results found.  Anti-infectives: Anti-infectives (From admission, onward)   Start     Dose/Rate Route Frequency Ordered Stop   07/15/17 2300  piperacillin-tazobactam (ZOSYN) IVPB 3.375 g     3.375 g 12.5 mL/hr over 240 Minutes Intravenous Every 8 hours 07/15/17 1817     07/15/17 1630  piperacillin-tazobactam (ZOSYN) IVPB 3.375 g     3.375 g 100 mL/hr over 30 Minutes Intravenous  Once 07/15/17 1625 07/15/17 1736      Assessment/Plan: s/p Procedure(s): COLECTOMY WITH loop ileostomy (Left)   Await bowel function before advancing diet  PT recs for SNF WOCN consult [place] ABX on or after  Afebrile but slight peristance  in leukocytosis of 12k, could be post op atelectasis.  Foley out, IS use daily, will continue to monitor  Andres Labrum 07/20/2017

## 2017-07-20 NOTE — Evaluation (Signed)
Physical Therapy Evaluation Patient Details Name: Brenda Gregory MRN: [id number] DOB: Jun 17, 1940 Today's Date: 07/20/2017   History of Present Illness  Pt is a 77 year old female admitted from home s/p colectomy with loop ileostomy (Left).  PMH includes Htn, gout, DM, asthma and arthritis.  Clinical Impression  Pt is a 77 year old female who lives in a second floor apt with her daughter.  Pt is independent without AD at baseline.  She is in bed upon PT arrival and reports 10/10 pain over incision site.  Pt was apprehensive about movement at first and required encouragement throughout session.  RN administered pain medication at beginning of evaluation dur to high pain report.  Pt was able to sit at EOB with mod A to initiate movement.  Pt sitting balance is good.  She presented with overall weakness of UE and LE but was able to complete sets of 10 of LE exercises introduced by PT.  Pt was able to perform STS and standing pivot to chair with min A for initiation of movement and management of RW throughout transfer.  She maintained low foot clearance and shortened step length throughout transfer.  Pt was able to slowly lower herself into the chair with use of BUE.  Pt will continue to benefit from skilled PT with focus on strength, functional mobility, tolerance to activity, stair negotiation, pain management and safe management of RW.    Follow Up Recommendations SNF    Equipment Recommendations  None recommended by PT    Recommendations for Other Services       Precautions / Restrictions Precautions Precautions: Fall Restrictions Weight Bearing Restrictions: No      Mobility  Bed Mobility Overal bed mobility: Needs Assistance Bed Mobility: Supine to Sit     Supine to sit: Mod assist;HOB elevated     General bed mobility comments: Pt required assistance to initiate movement and was able to sit upright and balance at EOB following.  PT assisted with scooting to EOB and pt found R  LE to be more difficult to move than L.  Transfers Overall transfer level: Needs assistance Equipment used: Rolling walker (2 wheeled) Transfers: Sit to/from [facility] Sit to Stand: Min assist Stand pivot transfers: Min guard       General transfer comment: Pt required assistance to initiate standing and encouragement throughout transfer to keep moving.  PT provided VC's for managment of RW during turns and pt was able to manage.  Ambulation/Gait Ambulation/Gait assistance: (Did not perform.)              Stairs            Wheelchair Mobility    Modified Rankin (Stroke Patients Only)       Balance Overall balance assessment: Modified Independent                                           Pertinent Vitals/Pain Pain Assessment: 0-10 Pain Score: 10-Worst pain ever Pain Location: Incision site Pain Descriptors / Indicators: Sharp Pain Intervention(s): Monitored during session;RN gave pain meds during session    [place] expects to be discharged to:: Private residence Living Arrangements: Children(Daughter) Available Help at Discharge: Family;Available 24 hours/day Type of Home: Apartment Home Access: Stairs to enter Entrance Stairs-Rails: Can reach both Entrance Stairs-Number of Steps: 14 Home Layout: One level Home Equipment: Kasandra Knudsen -  single point      Prior Function Level of Independence: Independent         Comments: Was working part time at [employer]: Right    Extremity/Trunk Assessment   Upper Extremity Assessment Upper Extremity Assessment: Generalized weakness    Lower Extremity Assessment Lower Extremity Assessment: Generalized weakness    Cervical / Trunk Assessment Cervical / Trunk Assessment: Normal  Communication   Communication: No difficulties  Cognition Arousal/Alertness: Awake/alert Behavior During Therapy: WFL for tasks  assessed/performed Overall Cognitive Status: Within Functional Limits for tasks assessed                                        General Comments      Exercises General Exercises - Lower Extremity Ankle Circles/Pumps: AROM;20 reps;Supine Gluteal Sets: Strengthening;Both;10 reps;Supine Heel Slides: AAROM;Both;10 reps;Supine Hip ABduction/ADduction: Both;10 reps;AAROM;Supine   Assessment/Plan    PT Assessment Patient needs continued PT services  PT Problem List Decreased strength;Decreased mobility;Decreased balance;Decreased knowledge of use of DME;Decreased activity tolerance;Pain       PT Treatment Interventions DME instruction;Therapeutic activities;Gait training;Therapeutic exercise;Stair training;Balance training;Functional mobility training;Neuromuscular re-education;Patient/family education    PT Goals (Current goals can be found in the Care Plan section)  Acute Rehab PT Goals Patient Stated Goal: To return home eventually when she gets stronger. PT Goal Formulation: With patient Time For Goal Achievement: 08/03/17 Potential to Achieve Goals: Good    Frequency Min 2X/week   Barriers to discharge        Co-evaluation               AM-PAC PT "6 Clicks" Daily Activity  Outcome Measure Difficulty turning over in bed (including adjusting bedclothes, sheets and blankets)?: A Lot Difficulty moving from lying on back to sitting on the side of the bed? : A Lot Difficulty sitting down on and standing up from a chair with arms (e.g., wheelchair, bedside commode, etc,.)?: A Lot Help needed moving to and from a bed to chair (including a wheelchair)?: A Lot Help needed walking in hospital room?: A Lot Help needed climbing 3-5 steps with a railing? : A Lot 6 Click Score: 12    End of Session Equipment Utilized During Treatment: Gait belt Activity Tolerance: Patient limited by pain;Patient limited by fatigue Patient left: in chair;with chair alarm  set;with call bell/phone within reach Nurse Communication: Mobility status PT Visit Diagnosis: Muscle weakness (generalized) (M62.81);Unsteadiness on feet (R26.81);Pain Pain - part of body: (abdomen)    Time: 1250-1330 PT Time Calculation (min) (ACUTE ONLY): 40 min   Charges:   PT Evaluation $PT Eval Low Complexity: 1 Low PT Treatments $Therapeutic Exercise: 8-22 mins   PT G Codes:   PT G-Codes **NOT FOR INPATIENT CLASS** Functional Assessment Tool Used: AM-PAC 6 Clicks Basic Mobility     Roxanne Gates, PT, DPT   Roxanne Gates 07/20/2017, 1:44 PM

## 2017-07-21 LAB — GLUCOSE, CAPILLARY
GLUCOSE-CAPILLARY: 174 mg/dL — AB (ref 65–99)
GLUCOSE-CAPILLARY: 146 mg/dL — AB (ref 65–99)
GLUCOSE-CAPILLARY: 156 mg/dL — AB (ref 65–99)
Glucose-Capillary: 158 mg/dL — ABNORMAL HIGH (ref 65–99)
Glucose-Capillary: 162 mg/dL — ABNORMAL HIGH (ref 65–99)
Glucose-Capillary: 163 mg/dL — ABNORMAL HIGH (ref 65–99)
Glucose-Capillary: 173 mg/dL — ABNORMAL HIGH (ref 65–99)
Glucose-Capillary: 192 mg/dL — ABNORMAL HIGH (ref 65–99)

## 2017-07-21 LAB — BASIC METABOLIC PANEL
ANION GAP: 3 — AB (ref 5–15)
BUN: 30 mg/dL — ABNORMAL HIGH (ref 6–20)
CO2: 27 mmol/L (ref 22–32)
Calcium: 7.6 mg/dL — ABNORMAL LOW (ref 8.9–10.3)
Chloride: 103 mmol/L (ref 101–111)
Creatinine, Ser: 1.13 mg/dL — ABNORMAL HIGH (ref 0.44–1.00)
GFR calc Af Amer: 54 mL/min — ABNORMAL LOW (ref >60)
GFR, EST NON AFRICAN AMERICAN: 47 mL/min — AB (ref >60)
GLUCOSE: 178 mg/dL — AB (ref 65–99)
Potassium: 4.1 mmol/L (ref 3.5–5.1)
Sodium: 133 mmol/L — ABNORMAL LOW (ref 135–145)

## 2017-07-21 LAB — AEROBIC/ANAEROBIC CULTURE (SURGICAL/DEEP WOUND)

## 2017-07-21 LAB — AEROBIC/ANAEROBIC CULTURE W GRAM STAIN (SURGICAL/DEEP WOUND)

## 2017-07-21 MED ORDER — FAT EMULSION PLANT BASED 20 % IV EMUL
250.0000 mL | INTRAVENOUS | Status: AC
  Administered 2017-07-21: 250 mL via INTRAVENOUS
  Filled 2017-07-21: qty 250

## 2017-07-21 MED ORDER — TRACE MINERALS CR-CU-MN-SE-ZN 10-1000-500-60 MCG/ML IV SOLN
INTRAVENOUS | Status: AC
  Administered 2017-07-21: 18:00:00 via INTRAVENOUS
  Filled 2017-07-21: qty 1800

## 2017-07-21 MED ORDER — ENSURE ENLIVE PO LIQD
237.0000 mL | Freq: Two times a day (BID) | ORAL | Status: DC
  Administered 2017-07-22 – 2017-07-24 (×5): 237 mL via ORAL

## 2017-07-21 NOTE — Clinical Social Work Note (Signed)
Clinical Social Work Assessment  Patient Details  Name: Brenda Gregory MRN: [id number] Date of Birth: Jun 06, 1940  Date of referral:  07/21/17               Reason for consult:  Facility Placement                Permission sought to share information with:  Facility Sport and exercise psychologist, Family Supports Permission granted to share information::  Yes, Verbal Permission Granted  Name::     Brenda Gregory Daughter [phone]   Agency::  SNF admissions  Relationship::     Contact Information:     Housing/Transportation Living arrangements for the past 2 months:  Single Family Home Source of Information:  Patient Patient Interpreter Needed:  None Criminal Activity/Legal Involvement Pertinent to Current Situation/Hospitalization:  No - Comment as needed Significant Relationships:  Adult Children Lives with:  Adult Children Do you feel safe going back to the place where you live?  No Need for family participation in patient care:     Care giving concerns:  Patient feels she needs some short term rehab before she is able to return back home.   Social Worker assessment / plan:  Patient is a 77 year old female who is alert and oriented x4 and lives with her daughter.  Patient states she was in rehab a few years ago when she was living in [place], but has not been in rehab since then.  CSW explained to patient what the process is for finding SNF placement, and how insurance will pay for stay.  Patient stated she is in agreement with going to SNF for short term rehab.  Patient gave CSW permission to begin bed search in [place].  Patient did not express any other questions or concerns.  Employment status:  Retired Nurse, adult PT Recommendations:  [place] / Referral to community resources:  [place]  Patient/Family's Response to care:  Patient is in agreement with going to SNF for short term  rehab.  Patient/Family's Understanding of and Emotional Response to Diagnosis, Current Treatment, and Prognosis:  Patient expressed that she is hopeful that she will not have to be in SNF for very long.  Patient is motivated to work with therapy in order to make some progress.  Emotional Assessment Appearance:  Appears older than stated age Attitude/Demeanor/Rapport:    Affect (typically observed):  Appropriate, Stable Orientation:  Oriented to Self, Oriented to  Time, Oriented to Place Alcohol / Substance use:  Not Applicable Psych involvement (Current and /or in the community):  No (Comment)  Discharge Needs  Concerns to be addressed:  Lack of Support Readmission within the last 30 days:  No Current discharge risk:  Lack of support system Barriers to Discharge:  Ship broker, Continued Medical Work up   Anell Barr 07/21/2017, 6:23 PM

## 2017-07-21 NOTE — Consult Note (Addendum)
[place] Nurse ostomy follow up Stoma type/location: RLQ, loop ileostomy Stomal assessment/size: pouch intact, unable to measure.  Pouch changed by [facility] nurse yesterday. Pink, but dusky Per previous notes, red robinson catheter in place for under loop stoma. [place] nurse placed barrier ring over support to aid in seal Peristomal assessment: NA Treatment options for stomal/peristomal skin: NA Output none Ostomy pouching: 2pc. 2 3/4" in place Education provided: Patient is not receptive to much education. She has no output currently She lives with daughter in apartment, but will be going to SNF at [place] short term per patient.  Attempted to discuss opening and closing pouch with little interest from the patient. Patient gave me permission to contact daughter for possible teaching visit.  Enrolled patient in [place] Start Discharge program: Yes 3 extra pouching systems with barrier rings left in patient room  [place] Nurse will follow along with you for continued support with ostomy teaching and care [place] MSN, [place], [place], Jobeth Pangilinan, [place]

## 2017-07-21 NOTE — NC FL2 (Signed)
[place] LEVEL OF CARE SCREENING TOOL     IDENTIFICATION  Patient Name: Brenda Gregory Birthdate: 1941/03/07 Sex: female Admission Date (Current Location): 07/15/2017  [place] and [place] Number:  Engineering geologist and Address:  [facility], [address], [place], [place] [address]      Provider Number: [id number]  Attending Physician Name and Address:  Vickie Epley, MD  Relative Name and Phone Number:  Sherrye Payor Daughter [phone]     Current Level of Care: Hospital Recommended Level of Care: [place] Prior Approval Number:    Date Approved/Denied:   PASRR Number: [id number]  Discharge Plan: SNF    Current Diagnoses: Patient Active Problem List   Diagnosis Date Noted  . Abscess of sigmoid colon 07/15/2017  . Diabetes ([place]) 07/15/2017  . HTN (hypertension) 07/15/2017  . Gout 07/15/2017  . Acute renal failure superimposed on chronic kidney disease ([place]) 07/15/2017    Orientation RESPIRATION BLADDER Height & Weight     Self, Time, Situation, Place  Normal Continent Weight: 229 lb 15 oz (104.3 kg) Height:  5\' 5"  (165.1 cm)  BEHAVIORAL SYMPTOMS/MOOD NEUROLOGICAL BOWEL NUTRITION STATUS      Incontinent, Colostomy Diet(Full Liquid diet)  AMBULATORY STATUS COMMUNICATION OF NEEDS Skin   Limited Assist Verbally Surgical wounds                       Personal Care Assistance Level of Assistance  Bathing, Feeding, Dressing Bathing Assistance: Limited assistance Feeding assistance: Independent Dressing Assistance: Limited assistance     Functional Limitations Info  Sight, Hearing, Speech Sight Info: Adequate Hearing Info: Adequate Speech Info: Adequate    SPECIAL CARE FACTORS FREQUENCY  OT (By licensed OT), PT (By licensed PT)     PT Frequency: 5x a week OT Frequency: 5x a week            Contractures Contractures Info: Not present    Additional Factors Info  Code  Status, Allergies, Insulin Sliding Scale Code Status Info: Full Code Allergies Info: NKA   Insulin Sliding Scale Info: insulin aspart (novoLOG) injection 0-15 Units 3x a day with meals       Current Medications (07/21/2017):  This is the current hospital active medication list Current Facility-Administered Medications  Medication Dose Route Frequency Provider Last Rate Last Dose  . acetaminophen (TYLENOL) tablet 650 mg  650 mg Oral Q6H PRN Vickie Epley, MD       Or  . acetaminophen (TYLENOL) suppository 650 mg  650 mg Rectal Q6H PRN Vickie Epley, MD      . chlorhexidine (PERIDEX) 0.12 % solution 15 mL  15 mL Mouth Rinse BID Vickie Epley, MD   15 mL at 07/21/17 0939  . enoxaparin (LOVENOX) injection 40 mg  40 mg Subcutaneous Q24H Vickie Epley, MD   40 mg at 07/21/17 0805  . TPN (CLINIMIX-E) Adult   Intravenous Continuous TPN Vickie Epley, MD 75 mL/hr at 07/21/17 1804     And  . Fat emulsion 20 % infusion 250 mL  250 mL Intravenous Continuous TPN Vickie Epley, MD 20 mL/hr at 07/21/17 1808 250 mL at 07/21/17 1808  . feeding supplement (ENSURE ENLIVE) (ENSURE ENLIVE) liquid 237 mL  237 mL Oral BID BM Vickie Epley, MD      . insulin aspart (novoLOG) injection 0-15 Units  0-15 Units Subcutaneous Q4H Lance Coon, MD   3 Units at 07/21/17 1552  .  lactated ringers infusion   Intravenous Continuous Rocky Morel, RPH 50 mL/hr at 07/21/17 8366    . lisinopril (PRINIVIL,ZESTRIL) tablet 20 mg  20 mg Oral Daily Vickie Epley, MD   20 mg at 07/21/17 0941  . MEDLINE mouth rinse  15 mL Mouth Rinse q12n4p Vickie Epley, MD   15 mL at 07/21/17 1553  . morphine 2 MG/ML injection 2 mg  2 mg Intravenous Q3H PRN Vickie Epley, MD   2 mg at 07/20/17 0457  . ondansetron (ZOFRAN-ODT) disintegrating tablet 4 mg  4 mg Oral Q6H PRN Vickie Epley, MD       Or  . ondansetron [facility]) injection 4 mg  4 mg Intravenous Q6H PRN Vickie Epley, MD      . oxyCODONE (Oxy  IR/ROXICODONE) immediate release tablet 5-10 mg  5-10 mg Oral Q4H PRN Vickie Epley, MD   10 mg at 07/21/17 2947  . piperacillin-tazobactam (ZOSYN) IVPB 3.375 g  3.375 g Intravenous Q8H Vickie Epley, MD   Stopped at 07/21/17 1454  . sodium chloride flush (NS) 0.9 % injection 10-40 mL  10-40 mL Intracatheter Q12H Vickie Epley, MD   20 mL at 07/21/17 0940  . sodium chloride flush (NS) 0.9 % injection 10-40 mL  10-40 mL Intracatheter PRN Vickie Epley, MD      . sodium chloride flush (NS) 0.9 % injection 5 mL  5 mL Intracatheter Q8H Greggory Keen, MD   5 mL at 07/18/17 1517     Discharge Medications: Please see discharge summary for a list of discharge medications.  Relevant Imaging Results:  Relevant Lab Results:   Additional Information SSN [id number]  Ross Ludwig, [place]

## 2017-07-21 NOTE — Progress Notes (Signed)
[facility] [place] NAME: Brenda Gregory    MR#:  [id number]  DATE OF BIRTH:  17-Sep-1940  SUBJECTIVE:   Patient is status post partial colectomy with colostomy placement, drainage of the left lower quadrant abdominal wall abscess postop day 3 today.  Abdomen is soft, still has no ostomy output.  Diet to be advanced to full liquids today.    REVIEW OF SYSTEMS:    Review of Systems  Constitutional: Negative for chills and fever.  HENT: Negative for congestion and tinnitus.   Eyes: Negative for blurred vision and double vision.  Respiratory: Negative for cough, shortness of breath and wheezing.   Cardiovascular: Negative for chest pain, orthopnea and PND.  Gastrointestinal: Positive for abdominal pain. Negative for diarrhea, nausea and vomiting.  Genitourinary: Negative for dysuria and hematuria.  Neurological: Negative for dizziness, sensory change and focal weakness.  All other systems reviewed and are negative.   DRUG ALLERGIES:  No Known Allergies  VITALS:  Blood pressure (!) 156/74, pulse 73, temperature 97.6 F (36.4 C), temperature source Oral, resp. rate 18, height 5\' 5"  (1.651 m), weight 104.3 kg (229 lb 15 oz), SpO2 96 %.  PHYSICAL EXAMINATION:   Physical Exam  GENERAL:  77 y.o.-year-old obese patient sitting up in chair in no acute distress.   EYES: Pupils equal, round, reactive to light and accommodation. No scleral icterus. Extraocular muscles intact.  HEENT: Head atraumatic, normocephalic. Oropharynx and nasopharynx clear.  NECK:  Supple, no jugular venous distention. No thyroid enlargement, no tenderness.  LUNGS: Normal breath sounds bilaterally, no wheezing, rales, rhonchi. No use of accessory muscles of respiration.  CARDIOVASCULAR: S1, S2 normal. No murmurs, rubs, or gallops.  ABDOMEN: Soft, mid abdomen dressing in place, right-sided colostomy in place with brown liquid noted, left-sided 2 JP drains with  serosanguineous fluid draining.   EXTREMITIES: No cyanosis, clubbing or edema b/l.    NEUROLOGIC: Cranial nerves II through XII are intact. No focal Motor or sensory deficits b/l. Globally weak  PSYCHIATRIC: The patient is alert and oriented x 3.  SKIN: No obvious rash, lesion, or ulcer.    LABORATORY PANEL:   CBC Recent Labs  Lab 07/20/17 0509  WBC 12.2*  HGB 10.3*  HCT 30.3*  PLT 337   ------------------------------------------------------------------------------------------------------------------  Chemistries  Recent Labs  Lab 07/19/17 0629 07/20/17 0509 07/21/17 0601  NA 135 135 133*  K 3.7 4.1 4.1  CL 106 106 103  CO2 24 25 27   GLUCOSE 268* 181* 178*  BUN 22* 29* 30*  CREATININE 1.19* 1.32* 1.13*  CALCIUM 7.5* 7.6* 7.6*  MG 1.9 1.9  --   AST 25  --   --   ALT 22  --   --   ALKPHOS 91  --   --   BILITOT 0.5  --   --    ------------------------------------------------------------------------------------------------------------------  Cardiac Enzymes No results for input(s): TROPONINI in the last 168 hours. ------------------------------------------------------------------------------------------------------------------  RADIOLOGY:  No results found.   ASSESSMENT AND PLAN:   77 year old female with past medical history of attention, gout, diabetes, osteoarthritis, asthma who presents to the hospital due to abdominal pain and noted to have a pelvic wall abscess with connection to the sigmoid colon.  1.  Peri- colonic abscess-this is the cause of patient's worsening abdominal pain. Patient is status post CT-guided drainage of the peri-colonic abscess and subsequently S/p partial colectomy with colostomy placement, drainage of the left lower quadrant abdominal wall abscess POD # 3  today.  - cont. Pain control and further care per surgery.  - cont. TPN for now.  Tolerating clear liquids and advance to full liquids today, awaiting ostomy function to return.   Continue incentive spirometry, PT.    2.  Diabetes type 2 without complications- continue sliding scale insulin and Insulin in TPN.  Follow BS.   3.  Essential hypertension-continue lisinopril.    4.  hx of gout-no acute attack.  Hold allopurinol for now.  5. AKI - due to dehydration and poor PO intake.  - cont. TPN/fluids and Cr. At baseline now.    Physical therapy recommending short-term rehab.  All the records are reviewed and case discussed with Care Management/Social Worker. Management plans discussed with the patient, her daughter and they are in agreement.  CODE STATUS: Full code  DVT Prophylaxis: Lovenox  TOTAL TIME TAKING CARE OF THIS PATIENT: 25 minutes.   POSSIBLE D/C unclear, DEPENDING ON CLINICAL CONDITION.   Henreitta Leber M.D on 07/21/2017 at 2:44 PM  Between 7am to 6pm - Pager - [phone]  After 6pm go to [email or web address] - Proofreader  [facility] [place] [facility]  Office  [phone]  CC: Primary care physician; Derinda Late, MD

## 2017-07-21 NOTE — Progress Notes (Signed)
[place] NOTE   Pharmacy Consult for TPN Indication: prolonged ileus  Patient Measurements: Height: 5\' 5"  (165.1 cm) Weight: 229 lb 15 oz (104.3 kg) IBW/kg (Calculated) : 57 TPN AdjBW (KG): 68.8 Body mass index is 38.26 kg/m.  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 Patient has been advanced to CLD today.   GI: CLD- advance to full liquids Endo: hx of DM on SSI Insulin requirements in the past 24 hours: 18 units SSI  Will increase Insulin from 24 units to 28 units to bag of TPN Lytes: K 4.1, Mag 1.9, Phos 3.1, Ca 7.6, Alb 1.7, CorrCa 9.34 Renal: SCr 1.13, CrCl 52  TPN Access: PICC line 4/20 TPN start date: 4/20 Nutritional Goals (per RD recommendation on 4/21): 1758 kcal, 90 grams of protein, 2040 mL fluid daily (1800 mL from Clinimix, 240 mL from lipids)  Goal TPN rate is 75 ml/hr   Current Nutrition: TPN Clinimix 5/15 w/ electrolytes at 75 ml/hr  Plan:  Continue TPN at goal regimen of Clinimix E 5/15 at 75 mL/hr with MVI and trace elements + 20% ILE at 20 mL/hr over 12 hours  Will increase Regular Insulin from 24 units to 28 units in TPN bag  Continue SSI moderate scale q4h and adjust as needed  LR currently running at 50 ml/hr for total fluid rate of 125 ml/hr  Daily weights Strict I/O  F/u BG and electrolytes in AM  *Per Dietician-advancing to full liquids- continue TPN today at current rate-then may consider adjustment of TPN tomorrow if continues to tolerate diet?  Chinita Greenland PharmD Clinical Pharmacist 07/21/2017

## 2017-07-21 NOTE — Progress Notes (Signed)
Per MD okay for RN to advance diet to full liquids.  

## 2017-07-21 NOTE — Progress Notes (Signed)
3 Days Post-Op   Subjective/Chief Complaint: OOB to chair yesterday for more than 3 hours.  No complaints   Objective: Vital signs in last 24 hours: Temp:  [97.6 F (36.4 C)-98.6 F (37 C)] 97.6 F (36.4 C) (04/24 1143) Pulse Rate:  [73-84] 73 (04/24 1143) Resp:  [18] 18 (04/24 0241) BP: (148-162)/(68-81) 156/74 (04/24 1143) SpO2:  [94 %-96 %] 96 % (04/24 1143) Weight:  [229 lb 15 oz (104.3 kg)] 229 lb 15 oz (104.3 kg) (04/24 3818) Last BM Date: 07/18/17  Intake/Output from previous day: 04/23 0701 - 04/24 0700 In: 3199 [P.O.:540; I.V.:2533; IV Piggyback:126] Out: 195 [Drains:165; Stool:30] Intake/Output this shift: Total I/O In: 980 [I.V.:940; IV Piggyback:40] Out: 400 [Urine:400]  GI: Abdomen soft, ostomy pink and viable but no output.  drains serosanguinous, incision c/d/i  Lab Results:  Recent Labs    07/19/17 0629 07/20/17 0509  WBC 10.2 12.2*  HGB 11.0* 10.3*  HCT 32.4* 30.3*  PLT 354 337   BMET Recent Labs    07/20/17 0509 07/21/17 0601  NA 135 133*  K 4.1 4.1  CL 106 103  CO2 25 27  GLUCOSE 181* 178*  BUN 29* 30*  CREATININE 1.32* 1.13*  CALCIUM 7.6* 7.6*   PT/INR No results for input(s): LABPROT, INR in the last 72 hours. ABG No results for input(s): PHART, HCO3 in the last 72 hours.  Invalid input(s): PCO2, PO2  Studies/Results: No results found.  Anti-infectives: Anti-infectives (From admission, onward)   Start     Dose/Rate Route Frequency Ordered Stop   07/15/17 2300  piperacillin-tazobactam (ZOSYN) IVPB 3.375 g     3.375 g 12.5 mL/hr over 240 Minutes Intravenous Every 8 hours 07/15/17 1817     07/15/17 1630  piperacillin-tazobactam (ZOSYN) IVPB 3.375 g     3.375 g 100 mL/hr over 30 Minutes Intravenous  Once 07/15/17 1625 07/15/17 1736      Assessment/Plan: s/p Procedure(s): COLECTOMY WITH loop ileostomy (Left) Await bowel function but will start full liquids slowly today to stimulate bowel function.  Once tolerating full  liquids, DC TPN.   OOB to ambulation.  Continue PT.   Continue ABX for now Await pathology  LOS: 6 days    Andres Labrum 07/21/2017

## 2017-07-21 NOTE — Care Management Important Message (Signed)
Copy of signed IM left in patient's room.    

## 2017-07-22 LAB — CBC
HCT: 29.7 % — ABNORMAL LOW (ref 35.0–47.0)
HCT: 30.2 % — ABNORMAL LOW (ref 35.0–47.0)
Hemoglobin: 10.4 g/dL — ABNORMAL LOW (ref 12.0–16.0)
Hemoglobin: 10.2 g/dL — ABNORMAL LOW (ref 12.0–16.0)
MCH: 32.9 pg (ref 26.0–34.0)
MCH: 29.5 pg (ref 26.0–34.0)
MCHC: 35.1 g/dL (ref 32.0–36.0)
MCHC: 33.7 g/dL (ref 32.0–36.0)
MCV: 93.7 fL (ref 80.0–100.0)
MCV: 87.6 fL (ref 80.0–100.0)
PLATELETS: 355 10*3/uL (ref 150–440)
Platelets: 333 K/uL (ref 150–440)
RBC: 3.17 MIL/uL — ABNORMAL LOW (ref 3.80–5.20)
RBC: 3.44 MIL/uL — ABNORMAL LOW (ref 3.80–5.20)
RDW: 15.7 % — ABNORMAL HIGH (ref 11.5–14.5)
RDW: 14.8 % — ABNORMAL HIGH (ref 11.5–14.5)
WBC: 8.2 10*3/uL (ref 3.6–11.0)
WBC: 8.9 K/uL (ref 3.6–11.0)

## 2017-07-22 LAB — COMPREHENSIVE METABOLIC PANEL WITH GFR
ALT: 44 U/L (ref 14–54)
AST: 58 U/L — ABNORMAL HIGH (ref 15–41)
Albumin: 2 g/dL — ABNORMAL LOW (ref 3.5–5.0)
Alkaline Phosphatase: 107 U/L (ref 38–126)
Anion gap: 6 (ref 5–15)
BUN: 31 mg/dL — ABNORMAL HIGH (ref 6–20)
CO2: 24 mmol/L (ref 22–32)
Calcium: 7.7 mg/dL — ABNORMAL LOW (ref 8.9–10.3)
Chloride: 101 mmol/L (ref 101–111)
Creatinine, Ser: 1.09 mg/dL — ABNORMAL HIGH (ref 0.44–1.00)
GFR calc Af Amer: 57 mL/min — ABNORMAL LOW
GFR calc non Af Amer: 49 mL/min — ABNORMAL LOW
Glucose, Bld: 200 mg/dL — ABNORMAL HIGH (ref 65–99)
Potassium: 4.2 mmol/L (ref 3.5–5.1)
Sodium: 131 mmol/L — ABNORMAL LOW (ref 135–145)
Total Bilirubin: 0.8 mg/dL (ref 0.3–1.2)
Total Protein: 5.7 g/dL — ABNORMAL LOW (ref 6.5–8.1)

## 2017-07-22 LAB — GLUCOSE, CAPILLARY
GLUCOSE-CAPILLARY: 179 mg/dL — AB (ref 65–99)
GLUCOSE-CAPILLARY: 143 mg/dL — AB (ref 65–99)
GLUCOSE-CAPILLARY: 147 mg/dL — AB (ref 65–99)
Glucose-Capillary: 148 mg/dL — ABNORMAL HIGH (ref 65–99)
Glucose-Capillary: 154 mg/dL — ABNORMAL HIGH (ref 65–99)
Glucose-Capillary: 180 mg/dL — ABNORMAL HIGH (ref 65–99)

## 2017-07-22 LAB — PHOSPHORUS: Phosphorus: 3 mg/dL (ref 2.5–4.6)

## 2017-07-22 LAB — MAGNESIUM: Magnesium: 1.7 mg/dL (ref 1.7–2.4)

## 2017-07-22 MED ORDER — TRACE MINERALS CR-CU-MN-SE-ZN 10-1000-500-60 MCG/ML IV SOLN
INTRAVENOUS | Status: AC
  Administered 2017-07-22: 18:00:00 via INTRAVENOUS
  Filled 2017-07-22: qty 960

## 2017-07-22 MED ORDER — HYDRALAZINE HCL 20 MG/ML IJ SOLN
10.0000 mg | Freq: Four times a day (QID) | INTRAMUSCULAR | Status: DC | PRN
  Administered 2017-07-22: 10 mg via INTRAVENOUS
  Filled 2017-07-22: qty 1

## 2017-07-22 MED ORDER — FAT EMULSION PLANT BASED 20 % IV EMUL
240.0000 mL | INTRAVENOUS | Status: AC
  Administered 2017-07-22: 240 mL via INTRAVENOUS
  Filled 2017-07-22: qty 240

## 2017-07-22 NOTE — Progress Notes (Signed)
[name] NOTE   Pharmacy Consult for TPN Indication: prolonged ileus  Patient Measurements: Height: 5\' 5"  (165.1 cm) Weight: 231 lb 14.8 oz (105.2 kg) IBW/kg (Calculated) : 57 TPN AdjBW (KG): 68.8 Body mass index is 38.59 kg/m.  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 Patient has been advanced to FLD yesterday.   GI: CLD- advance to full liquids Endo: hx of DM on SSI - BG 146-180 last 24h within goal range Insulin requirements in the past 24 hours: 18 units SSI  Currently with insulin regular 28 units to bag of TPN Lytes: K 4.2, Mag 1.7, Phos 3.0, Ca 7.7, Alb 2.0, CorrCa 9.3 Renal: SCr 1.09, CrCl 54.5 ml/min  TPN Access: PICC line 4/20 TPN start date: 4/20 Nutritional Goals (per RD recommendation on 4/21): 1758 kcal, 90 grams of protein, 2040 mL fluid daily (1800 mL from Clinimix, 240 mL from lipids)  Goal TPN rate is 75 ml/hr   Current Nutrition: TPN Clinimix 5/15 w/ electrolytes at 75 ml/hr  Plan:  Per dietician planning to cut TPN rate in half tonight - will order Clinimix E 5/15 at 40 mL/hr with MVI and trace elements + 20% ILE at 20 mL/hr over 12 hours  Continue SSI moderate scale q4h and adjust as needed Planning to remove insulin from TPN bag per discussion with dietician out of concern for dropping BG  LR currently running at 50 ml/hr   Daily weights Strict I/O  F/u BG and electrolytes in AM   Rayna Sexton, PharmD, BCPS Clinical Pharmacist 07/22/2017 8:47 AM

## 2017-07-22 NOTE — Clinical Social Work Note (Signed)
CSW presented bed offers to patient and her daughter and they chose [facility]  CSW contacted [facility] and they will begin insurance authorization through [place]. CSW to continue to follow patient's progress throughout discharge planning.  Jones Broom. Norval Morton, MSW, [place]  07/22/2017 3:36 PM

## 2017-07-22 NOTE — Progress Notes (Signed)
Occupational Therapy Treatment Patient Details Name: Brenda Gregory MRN: [id number] DOB: [birth year] Today's Date: 07/22/2017    History of present illness Pt is a 77 year old female admitted from home s/p colectomy with loop ileostomy (Left).  PMH includes HTN, gout, DM, asthma and arthritis.   OT comments  Pt seen for OT treatment this date. Pt education provided for AE/DME, adaptive techniques, and home/routines modifications to improve independence with LB ADL tasks while minimizing discomfort/pressure to abdominal incision site which pt reports causing her  10/10 pain with any slight movement. At rest pt reports 6/10 abdominal pain. Encouraged pt to work more closely with her RN for better pain management. Pt verbalized understanding. Pt educated in use of wedge pillow for UB elevation to support improved comfort with sleeping and take pressure/tension off of the abdomen. Pt declines bed mobility secondary to pain. RN provided pain meds during session. OT noted pt's R hand to be edematous (+1 pitting). Educated pt on elevation and positioned on pillow to support decreased swelling. RN notified when in room to give pain meds. Will continue to progress. Pt continues to be STR appropriate.   Follow Up Recommendations  SNF    Equipment Recommendations  Tub/shower bench;3 in 1 bedside commode    Recommendations for Other Services      Precautions / Restrictions Precautions Precautions: Fall;Other (comment) Precaution Comments: abdominal incision Restrictions Weight Bearing Restrictions: No       Mobility Bed Mobility               General bed mobility comments: unable to attempt due to pain, RN provided pain meds during session  Transfers                      Balance                                           ADL either performed or assessed with clinical judgement   ADL Overall ADL's : Needs assistance/impaired                      Lower Body Dressing: Maximal assistance;Sit to/from stand;Moderate assistance Lower Body Dressing Details (indicate cue type and reason): pt educated in AE and adaptive strategies to perform LB dressing while minimizing pressure to abdominal incision.                      Vision Baseline Vision/History: Wears glasses Wears Glasses: At all times Patient Visual Report: No change from baseline     Perception     Praxis      Cognition Arousal/Alertness: Awake/alert Behavior During Therapy: WFL for tasks assessed/performed Overall Cognitive Status: Within Functional Limits for tasks assessed                                          Exercises Other Exercises Other Exercises: Pt instructed in benefits of a wedge pillow for UB elevation for sleep to improve comfort and minimize strain on abdominal incision Other Exercises: R hand noted with +1 pitting edema. Pt reports noticing it this date. RN notified when room to take BP and give pain meds. Pt instructed in elevation of RUE on pillow to support decreased edema   Shoulder  Instructions       General Comments      Pertinent Vitals/ Pain       Pain Assessment: 0-10 Pain Score: 6  Pain Location: 6/10 at rest, 10/10 with any movement Pain Descriptors / Indicators: Sharp Pain Intervention(s): Limited activity within patient's tolerance;Monitored during session;RN gave pain meds during session  Home Living                                          Prior Functioning/Environment              Frequency  Min 1X/week        Progress Toward Goals  OT Goals(current goals can now be found in the care plan section)  Progress towards OT goals: Progressing toward goals  Acute Rehab OT Goals Patient Stated Goal: To return home eventually when she gets stronger. OT Goal Formulation: With patient Time For Goal Achievement: 08/03/17 Potential to Achieve Goals: Good  Plan Discharge plan  remains appropriate;Frequency remains appropriate    Co-evaluation                 AM-PAC PT "6 Clicks" Daily Activity     Outcome Measure   Help from another person eating meals?: None Help from another person taking care of personal grooming?: A Little Help from another person toileting, which includes using toliet, bedpan, or urinal?: A Little Help from another person bathing (including washing, rinsing, drying)?: A Lot Help from another person to put on and taking off regular upper body clothing?: A Lot Help from another person to put on and taking off regular lower body clothing?: A Lot 6 Click Score: 16    End of Session    OT Visit Diagnosis: Other abnormalities of gait and mobility (R26.89);Pain;Muscle weakness (generalized) (M62.81) Pain - Right/Left: (abdomen) Pain - part of body: (abdomen)   Activity Tolerance Patient limited by pain   Patient Left in bed;with bed alarm set;with nursing/sitter in room   Nurse Communication          Time: 4315-4008 OT Time Calculation (min): 16 min  Charges: OT General Charges $OT Visit: 1 Visit OT Treatments $Self Care/Home Management : 8-22 mins  Jeni Salles, MPH, MS, OTR/L ascom [phone] 07/22/17, 3:46 PM

## 2017-07-22 NOTE — Progress Notes (Signed)
Physical Therapy Treatment Patient Details Name: Brenda Gregory MRN: [id number] DOB: Oct 08, 1940 Today's Date: 07/22/2017    History of Present Illness Pt is a 77 year old female admitted from home s/p colectomy with loop ileostomy (Left).  PMH includes HTN, gout, DM, asthma and arthritis.    PT Comments    Pt agreeable to PT; reports 9/10 pain with movement/function. Pt did demonstrate good tolerance with slow, cued and assisted movement and education on abdominal bracing. Pt requires Min A for supine to sit and Mod A for sit to supine, +2 for repositioning bed mobility. Min A for STS transfers with encouragement for knee/glut extension and cues for safe hand placement 100% of the time. Improving ability overall and able to manage mildly effortful steps bed to from [facility] for toileting. Min guard for stand during total care personal hygiene post toileting. Pt encouraged/educated on general isometric leg and abdominal strengthening as well as ankle pumps. Continue PT to progress strength, endurance and safety to improve functional mobility.    Follow Up Recommendations  SNF     Equipment Recommendations  None recommended by PT    Recommendations for Other Services       Precautions / Restrictions Precautions Precautions: Fall;Other (comment) Precaution Comments: abdominal incision Restrictions Weight Bearing Restrictions: No    Mobility  Bed Mobility Overal bed mobility: Needs Assistance Bed Mobility: Supine to Sit;Sit to Supine     Supine to sit: Min assist;HOB elevated Sit to supine: Mod assist(Mod x 2 for repositioning upward in bed)   General bed mobility comments: Encouraged abdominal bracing during activity  Transfers Overall transfer level: Needs assistance Equipment used: Rolling walker (2 wheeled) Transfers: Sit to/from Stand Sit to Stand: Min assist         General transfer comment: Performed twice. Cues for safe hand placement each transfer. Slow, cues  required to activate knee/glut extension for stand  Ambulation/Gait Ambulation/Gait assistance: Min guard Ambulation Distance (Feet): 3 Feet(performed twice) Assistive device: Rolling walker (2 wheeled) Gait Pattern/deviations: Step-to pattern;Trunk flexed     General Gait Details: Bed to from [place] Mobility    Modified Rankin (Stroke Patients Only)       Balance Overall balance assessment: Needs assistance Sitting-balance support: Bilateral upper extremity supported;Feet supported Sitting balance-Leahy Scale: Fair     Standing balance support: Bilateral upper extremity supported Standing balance-Leahy Scale: Fair                              Cognition Arousal/Alertness: Awake/alert Behavior During Therapy: WFL for tasks assessed/performed Overall Cognitive Status: Within Functional Limits for tasks assessed                                        Exercises General Exercises - Lower Extremity Ankle Circles/Pumps: AROM;Both;20 reps;Supine Quad Sets: Strengthening;Both;15 reps;Supine Gluteal Sets: Strengthening;Both;15 reps;Supine Other Exercises Other Exercises: abdominal bracing 5x Other Exercises: Min guard support during personal hygiene, in which total care required    General Comments General comments (skin integrity, edema, etc.): RUE swelling throughout forearm and hand. Elevated on pillow. Pt/family state nursing aware      Pertinent Vitals/Pain Pain Assessment: 0-10 Pain Score: 9 (with movement; none at complete rest) Pain Location: abdoment with movement Pain Descriptors / Indicators:  Sharp Pain Intervention(s): Monitored during session    Home Living                      Prior Function            PT Goals (current goals can now be found in the care plan section) Acute Rehab PT Goals Patient Stated Goal: To return home eventually when she gets stronger. Progress towards  PT goals: Progressing toward goals    Frequency    Min 2X/week      PT Plan Current plan remains appropriate    Co-evaluation              AM-PAC PT "6 Clicks" Daily Activity  Outcome Measure  Difficulty turning over in bed (including adjusting bedclothes, sheets and blankets)?: Unable Difficulty moving from lying on back to sitting on the side of the bed? : Unable Difficulty sitting down on and standing up from a chair with arms (e.g., wheelchair, bedside commode, etc,.)?: Unable Help needed moving to and from a bed to chair (including a wheelchair)?: A Little Help needed walking in hospital room?: A Little Help needed climbing 3-5 steps with a railing? : Total 6 Click Score: 10    End of Session   Activity Tolerance: Patient limited by fatigue;Patient limited by pain;Patient tolerated treatment well Patient left: in bed;with call bell/phone within reach;with bed alarm set;with family/visitor present   PT Visit Diagnosis: Muscle weakness (generalized) (M62.81);Unsteadiness on feet (R26.81);Pain     Time: 4540-9811 PT Time Calculation (min) (ACUTE ONLY): 30 min  Charges:  $Gait Training: 8-22 mins $Therapeutic Activity: 8-22 mins                    G Codes:        Larae Grooms, PTA 07/22/2017, 4:56 PM

## 2017-07-22 NOTE — Progress Notes (Signed)
Nutrition Follow-up  DOCUMENTATION CODES:   Not applicable  INTERVENTION:   Ensure Enlive po BID, each supplement provides 350 kcal and 20 grams of protein  Decrease Clinimix E 5/15 to 40 mL/hr- will plan to discontinue 4/26 per MD  Continue 20% ILE at 20 mL/hr over 12 hours.   Continue adult MVI and trace elements as daily TPN additives.  NUTRITION DIAGNOSIS:   Increased nutrient needs related to acute illness, wound healing(abscess on sigmoid colon) as evidenced by estimated needs.  Ongoing.  GOAL:   Patient will meet greater than or equal to 90% of their needs  Met TPN.  MONITOR:   PO intake, Diet advancement, Supplement acceptance, Labs, I & O's, Weight trends TPN  ASSESSMENT:   77 y.o. female with a past medical history of asthma, arthritis, diabetes, hypertension, presents to the emergency department for left lower quadrant abdominal pain. States she has lost weight due to decreased appetite over the past 10 days as well.  Not drinking or eating much. CT appears to show a perforated sigmoid colon with 9 x 12 abscess. Likely diverticular in origin.   Pt tolerating full liquid diet. GI soft diet today. Will plan to decrease TPN today and discontinue tomorrow. Ensure added. Ostomy output improving. Per chart, pt with ~30lb weight gain since admit; pt +20L on I & Os. Pt with slight edema. Pt with hyperglycemia; will plan to remove insulin from TPN tonight as rate is decreasing. Spoke to RN, plan to monitor blood glucoses closely.   Medications reviewed and include: lovenox, insulin, LRS @50ml /hr, zosyn, oxycodone   Labs reviewed: Na 131(L), K 4.2 wnl, BUN 31(H), creat 1.09(H), P 3.0 wnl, Mg 2.0(L) Triglycerides- 33 wnl- 4/22 cbgs- 181, 178, 200 x 48hrs   Diet Order:  Diet full liquid Room service appropriate? Yes; Fluid consistency: Thin TPN (CLINIMIX-E) Adult .TPN (CLINIMIX-E) Adult  EDUCATION NEEDS:   Education needs have been addressed  Skin:   Reviewed RN  Assessment  Last BM:  4/25 type 7 via ostomy   Height:   Ht Readings from Last 1 Encounters:  07/21/17 5' 5"  (1.651 m)    Weight:   Wt Readings from Last 1 Encounters:  07/22/17 231 lb 14.8 oz (105.2 kg)    Ideal Body Weight:  62.5 kg  BMI:  Body mass index is 38.59 kg/m.  Estimated Nutritional Needs:   Kcal:  1700-2000 kcals (MSJ ABW x 1.2-1.4)  Protein:  81-94 grams (IBW x 1.3-1.5)  Fluid:  <1.7 L/day  Koleen Distance MS, RD, LDN Pager #- [phone] After Hours Pager: [phone]

## 2017-07-22 NOTE — Progress Notes (Signed)
[facility] [place] NAME: Brenda Gregory    MR#:  [id number]  DATE OF BIRTH:  1940/11/19  SUBJECTIVE:   Patient is status post partial colectomy with colostomy placement, drainage of the left lower quadrant abdominal wall abscess postop day 3 today.  Patient is still complaining of some abdominal pain.  Had ostomy output today.  Tolerating full liquids with Ensure supplements.  REVIEW OF SYSTEMS:    Review of Systems  Constitutional: Negative for chills and fever.  HENT: Negative for congestion and tinnitus.   Eyes: Negative for blurred vision and double vision.  Respiratory: Negative for cough, shortness of breath and wheezing.   Cardiovascular: Negative for chest pain, orthopnea and PND.  Gastrointestinal: Positive for abdominal pain. Negative for diarrhea, nausea and vomiting.  Genitourinary: Negative for dysuria and hematuria.  Neurological: Negative for dizziness, sensory change and focal weakness.  All other systems reviewed and are negative.   DRUG ALLERGIES:  No Known Allergies  VITALS:  Blood pressure (!) 160/81, pulse 82, temperature 98.2 F (36.8 C), resp. rate 18, height 5\' 5"  (1.651 m), weight 105.2 kg (231 lb 14.8 oz), SpO2 98 %.  PHYSICAL EXAMINATION:   Physical Exam  GENERAL:  77 y.o.-year-old obese patient sitting up in chair in no acute distress.   EYES: Pupils equal, round, reactive to light and accommodation. No scleral icterus. Extraocular muscles intact.  HEENT: Head atraumatic, normocephalic. Oropharynx and nasopharynx clear.  NECK:  Supple, no jugular venous distention. No thyroid enlargement, no tenderness.  LUNGS: Normal breath sounds bilaterally, no wheezing, rales, rhonchi. No use of accessory muscles of respiration.  CARDIOVASCULAR: S1, S2 normal. No murmurs, rubs, or gallops.  ABDOMEN: Soft, mid abdomen dressing in place, right-sided colostomy in place with brown liquid noted, left-sided 2 JP drains  with serosanguineous fluid draining.   EXTREMITIES: No cyanosis, clubbing or edema b/l.    NEUROLOGIC: Cranial nerves II through XII are intact. No focal Motor or sensory deficits b/l. Globally weak.   PSYCHIATRIC: The patient is alert and oriented x 3.  SKIN: No obvious rash, lesion, or ulcer.    LABORATORY PANEL:   CBC Recent Labs  Lab 07/22/17 0730  WBC 8.9  HGB 10.2*  HCT 30.2*  PLT 333   ------------------------------------------------------------------------------------------------------------------  Chemistries  Recent Labs  Lab 07/22/17 0730  NA 131*  K 4.2  CL 101  CO2 24  GLUCOSE 200*  BUN 31*  CREATININE 1.09*  CALCIUM 7.7*  MG 1.7  AST 58*  ALT 44  ALKPHOS 107  BILITOT 0.8   ------------------------------------------------------------------------------------------------------------------  Cardiac Enzymes No results for input(s): TROPONINI in the last 168 hours. ------------------------------------------------------------------------------------------------------------------  RADIOLOGY:  No results found.   ASSESSMENT AND PLAN:   77 year old female with past medical history of attention, gout, diabetes, osteoarthritis, asthma who presents to the hospital due to abdominal pain and noted to have a pelvic wall abscess with connection to the sigmoid colon.  1.  Peri- colonic abscess-this is the cause of patient's worsening abdominal pain. Patient is status post CT-guided drainage of the peri-colonic abscess and subsequently S/p partial colectomy with colostomy placement, drainage of the left lower quadrant abdominal wall abscess POD # 4 today.  -Patient has ostomy output now, tolerating full liquids with Ensure supplements.  As per surgery will DC TPN today, advance diet, continue PT and pain control as per general surgery.    2.  Diabetes type 2 without complications- continue sliding scale insulin and Insulin in  TPN.   - BS stable.   3.  Essential  hypertension-continue lisinopril.    4.  hx of gout- no acute attack.  Hold allopurinol for now.  5. AKI - due to dehydration and poor PO intake.  - cont. TPN/fluids and Cr. At baseline now.    Physical therapy recommending short-term rehab and likely discharge in next 1-2 days.   All the records are reviewed and case discussed with Care Management/Social Worker. Management plans discussed with the patient, her daughter and they are in agreement.  CODE STATUS: Full code  DVT Prophylaxis: Lovenox  TOTAL TIME TAKING CARE OF THIS PATIENT: 25 minutes.   POSSIBLE D/C 1-2, DEPENDING ON CLINICAL CONDITION.   Henreitta Leber M.D on 07/22/2017 at 1:42 PM  Between 7am to 6pm - Pager - [phone]  After 6pm go to [email or web address] - Proofreader  [facility]  Office  [phone]  CC: Primary care physician; Derinda Late, MD

## 2017-07-22 NOTE — Consult Note (Signed)
[facility] Nurse ostomy follow up Stoma type/location: RLQ Loop ileostomy with red rubber cath retention sewn into place Stomal assessment/size: 1 1/4" edematous Peristomal assessment: intact red rubber cath in place for retention.  Barrier ring over this to promote seal Treatment options for stomal/peristomal skin: barrier ring Output soft brown stool Ostomy pouching: 2pc. 2 3/4" pouch with barrier ring  Education provided: Daughter not available this AM.  Left message to coordinate care but patient states her daughter has an MD appointment Friday AM.  Pouch change performed now due to leakage.  Discussed function of red rubber cath and that it would be removed and pouching would be improved.  Discussed need for barrier ring. Twice weekly pouch changes, showering and emptying when 1/3 full all covered today.  Patient will discharge to rehab for ongoing therapy and ostomy education.  Enrolled patient in [place] [facility] Discharge program: Yes [facility] team will follow. Domenic Moras RN BSN [facility] Pager [phone]

## 2017-07-22 NOTE — Progress Notes (Signed)
4 Days Post-Op   Subjective/Chief Complaint: Patient was able to transfer to the bedside commode.  Tolerating Ensure protein drinks without nausea.  Ostomy output has started overnight.  Otherwise no complaints   Objective: Vital signs in last 24 hours: Temp:  [98.2 F (36.8 C)-98.3 F (36.8 C)] 98.2 F (36.8 C) (04/25 0852) Pulse Rate:  [81-98] 82 (04/25 0852) Resp:  [18-22] 18 (04/25 0852) BP: (160-163)/(75-87) 160/81 (04/25 0852) SpO2:  [94 %-98 %] 98 % (04/25 0852) Weight:  [231 lb 14.8 oz (105.2 kg)] 231 lb 14.8 oz (105.2 kg) (04/25 0500) Last BM Date: 07/22/17  Intake/Output from previous day: 04/24 0701 - 04/25 0700 In: 2990.9 [I.V.:2880.9; IV Piggyback:85] Out: 400 [Urine:400] Intake/Output this shift: Total I/O In: 1150.6 [P.O.:240; I.V.:869.6; IV Piggyback:41] Out: -   GI: Ostomy is pink, patent, and productive.  Midline wound healing well.  Drains are serosanguineous  Lab Results:  Recent Labs    07/22/17 0456 07/22/17 0730  WBC 8.2 8.9  HGB 10.4* 10.2*  HCT 29.7* 30.2*  PLT 355 333   BMET Recent Labs    07/21/17 0601 07/22/17 0730  NA 133* 131*  K 4.1 4.2  CL 103 101  CO2 27 24  GLUCOSE 178* 200*  BUN 30* 31*  CREATININE 1.13* 1.09*  CALCIUM 7.6* 7.7*   PT/INR No results for input(s): LABPROT, INR in the last 72 hours. ABG No results for input(s): PHART, HCO3 in the last 72 hours.  Invalid input(s): PCO2, PO2  Studies/Results: No results found.  Anti-infectives: Anti-infectives (From admission, onward)   Start     Dose/Rate Route Frequency Ordered Stop   07/15/17 2300  piperacillin-tazobactam (ZOSYN) IVPB 3.375 g     3.375 g 12.5 mL/hr over 240 Minutes Intravenous Every 8 hours 07/15/17 1817     07/15/17 1630  piperacillin-tazobactam (ZOSYN) IVPB 3.375 g     3.375 g 100 mL/hr over 30 Minutes Intravenous  Once 07/15/17 1625 07/15/17 1736      Assessment/Plan: s/p Procedure(s): COLECTOMY WITH loop ileostomy (Left) Advance diet  as tolerated, discontinue parenteral nutrition.   Continue PT/OT.   Dressing changes.   Continue antibiotics to complete course since source control. Lovenox  LOS: 7 days    Andres Labrum 07/22/2017

## 2017-07-23 LAB — BASIC METABOLIC PANEL
Anion gap: 7 (ref 5–15)
BUN: 28 mg/dL — AB (ref 6–20)
CHLORIDE: 100 mmol/L — AB (ref 101–111)
CO2: 26 mmol/L (ref 22–32)
CREATININE: 1.14 mg/dL — AB (ref 0.44–1.00)
Calcium: 7.9 mg/dL — ABNORMAL LOW (ref 8.9–10.3)
GFR calc Af Amer: 54 mL/min — ABNORMAL LOW (ref >60)
GFR calc non Af Amer: 46 mL/min — ABNORMAL LOW (ref >60)
Glucose, Bld: 127 mg/dL — ABNORMAL HIGH (ref 65–99)
Potassium: 4.3 mmol/L (ref 3.5–5.1)
Sodium: 133 mmol/L — ABNORMAL LOW (ref 135–145)

## 2017-07-23 LAB — GLUCOSE, CAPILLARY
GLUCOSE-CAPILLARY: 140 mg/dL — AB (ref 65–99)
GLUCOSE-CAPILLARY: 161 mg/dL — AB (ref 65–99)
Glucose-Capillary: 101 mg/dL — ABNORMAL HIGH (ref 65–99)
Glucose-Capillary: 115 mg/dL — ABNORMAL HIGH (ref 65–99)
Glucose-Capillary: 140 mg/dL — ABNORMAL HIGH (ref 65–99)
Glucose-Capillary: 148 mg/dL — ABNORMAL HIGH (ref 65–99)
Glucose-Capillary: 165 mg/dL — ABNORMAL HIGH (ref 65–99)

## 2017-07-23 LAB — MAGNESIUM: Magnesium: 1.6 mg/dL — ABNORMAL LOW (ref 1.7–2.4)

## 2017-07-23 LAB — SURGICAL PATHOLOGY

## 2017-07-23 LAB — PHOSPHORUS: PHOSPHORUS: 3.5 mg/dL (ref 2.5–4.6)

## 2017-07-23 MED ORDER — MAGNESIUM SULFATE 2 GM/50ML IV SOLN
2.0000 g | Freq: Once | INTRAVENOUS | Status: AC
  Administered 2017-07-23: 2 g via INTRAVENOUS
  Filled 2017-07-23: qty 50

## 2017-07-23 NOTE — Progress Notes (Signed)
Brenda Gregory at [place] NAME: Brenda Gregory    MR#:  [id number]  DATE OF BIRTH:  1940/05/08  SUBJECTIVE:   No acute events overnight.  Abdominal pain has improved.  Patient has stool in his ostomy. TPN discontinued.    REVIEW OF SYSTEMS:    Review of Systems  Constitutional: Negative for chills and fever.  HENT: Negative for congestion and tinnitus.   Eyes: Negative for blurred vision and double vision.  Respiratory: Negative for cough, shortness of breath and wheezing.   Cardiovascular: Negative for chest pain, orthopnea and PND.  Gastrointestinal: Positive for abdominal pain. Negative for diarrhea, nausea and vomiting.  Genitourinary: Negative for dysuria and hematuria.  Neurological: Negative for dizziness, sensory change and focal weakness.  All other systems reviewed and are negative.   DRUG ALLERGIES:  No Known Allergies  VITALS:  Blood pressure 134/70, pulse 81, temperature 97.9 F (36.6 C), temperature source Oral, resp. rate 20, height 5\' 5"  (1.651 m), weight 108.9 kg (240 lb 1.3 oz), SpO2 96 %.  PHYSICAL EXAMINATION:   Physical Exam  GENERAL:  77 y.o.-year-old obese patient sitting up in chair in no acute distress.   EYES: Pupils equal, round, reactive to light and accommodation. No scleral icterus. Extraocular muscles intact.  HEENT: Head atraumatic, normocephalic. Oropharynx and nasopharynx clear.  NECK:  Supple, no jugular venous distention. No thyroid enlargement, no tenderness.  LUNGS: Normal breath sounds bilaterally, no wheezing, rales, rhonchi. No use of accessory muscles of respiration.  CARDIOVASCULAR: S1, S2 normal. No murmurs, rubs, or gallops.  ABDOMEN: Soft, mid abdomen dressing in place, right-sided colostomy in place with brown liquid noted, left-sided 2 JP drains with serosanguineous fluid draining.   EXTREMITIES: No cyanosis, clubbing or edema b/l.    NEUROLOGIC: Cranial nerves II through XII are intact.  No focal Motor or sensory deficits b/l. Globally weak.   PSYCHIATRIC: The patient is alert and oriented x 3.  SKIN: No obvious rash, lesion, or ulcer.    LABORATORY PANEL:   CBC Recent Labs  Lab 07/22/17 0730  WBC 8.9  HGB 10.2*  HCT 30.2*  PLT 333   ------------------------------------------------------------------------------------------------------------------  Chemistries  Recent Labs  Lab 07/22/17 0730 07/23/17 0956  NA 131* 133*  K 4.2 4.3  CL 101 100*  CO2 24 26  GLUCOSE 200* 127*  BUN 31* 28*  CREATININE 1.09* 1.14*  CALCIUM 7.7* 7.9*  MG 1.7 1.6*  AST 58*  --   ALT 44  --   ALKPHOS 107  --   BILITOT 0.8  --    ------------------------------------------------------------------------------------------------------------------  Cardiac Enzymes No results for input(s): TROPONINI in the last 168 hours. ------------------------------------------------------------------------------------------------------------------  RADIOLOGY:  No results found.   ASSESSMENT AND PLAN:   77 year old female with past medical history of attention, gout, diabetes, osteoarthritis, asthma who presents to the hospital due to abdominal pain and noted to have a pelvic wall abscess with connection to the sigmoid colon.  1.  Peri- colonic abscess-this is the cause of patient's worsening abdominal pain. Patient is status post CT-guided drainage of the peri-colonic abscess and subsequently S/p partial colectomy with colostomy placement, drainage of the left lower quadrant abdominal wall abscess POD # 5 today.  -Patient has ostomy output now, diet advanced to regular diet and tolerating it.   As per surgery will DC TPN today, continue PT and pain control as per general surgery.    2.  Diabetes type 2 without complications- continue sliding scale insulin  and BS Stable and will monitor.  - BS stable.   3.  Essential hypertension-continue lisinopril.    4.  hx of gout- no acute attack.   Hold allopurinol for now.  5. AKI - due to dehydration and poor PO intake.  - improved w/ IV fluids and will cont. To monitor.   Physical therapy recommending short-term rehab and likely discharge in next 1-2 days.   All the records are reviewed and case discussed with Care Management/Social Worker. Management plans discussed with the patient, her daughter and they are in agreement.  CODE STATUS: Full code  DVT Prophylaxis: Lovenox  TOTAL TIME TAKING CARE OF THIS PATIENT: 25 minutes.   POSSIBLE D/C 1-2 days, DEPENDING ON CLINICAL CONDITION.   Henreitta Leber M.D on 07/23/2017 at 2:11 PM  Between 7am to 6pm - Pager - [phone]  After 6pm go to [email or web address] - Proofreader  [facility]  Office  [phone]  CC: Primary care physician; Derinda Late, MD

## 2017-07-23 NOTE — Progress Notes (Addendum)
ADDENDUM: Patient tolerating diet, pain well controlled. Discussed with patient pathology results.  Surgical Pathology (07/18/2017) SIGMOID COLON; PARTIAL COLECTOMY:  RUPTURED DIVERTICULITIS WITH ABSCESS FORMATION AND DIRECT EXTENSION TO INVOLVE OVARY. NO EVIDENCE OF MALIGNANCY OR HIGH-GRADE DYSPLASIA  -- Corene Cornea E. [name], MD, [place]: [facility] - Partnering for exceptional care. Office: [place] Hospital Day(s): 8.   Post op day(s): 5 Days Post-Op.   Interval History: Patient seen and examined, no acute events or new complaints overnight. Patient reports her pain has been controlled and she's been tolerating advancement of her diet, denies N/V, fever/chills, CP, or SOB, has been ambulating without difficulty, and her ileostomy has been functioning without leaking around the ostomy.  Review of Systems:  Constitutional: denies fever, chills  Respiratory: denies any shortness of breath  Cardiovascular: denies chest pain or palpitations  Gastrointestinal: abdominal pain, N/V, and bowel function as per interval history Musculoskeletal: denies pain, decreased motor or sensation Integumentary: denies any other rashes or skin discolorations except post-surgical abdominal wounds  Vital signs in last 24 hours: [min-max] current  Temp:  [97.6 F (36.4 C)-98.2 F (36.8 C)] 97.7 F (36.5 C) (04/26 0431) Pulse Rate:  [78-93] 78 (04/26 0431) Resp:  [18-20] 20 (04/26 0431) BP: (144-172)/(64-85) 144/73 (04/26 0431) SpO2:  [94 %-98 %] 96 % (04/26 0431) Weight:  [240 lb 1.3 oz (108.9 kg)] 240 lb 1.3 oz (108.9 kg) (04/26 0500)     Height: 5\' 5"  (165.1 cm) Weight: 240 lb 1.3 oz (108.9 kg) BMI (Calculated): 39.95   Intake/Output this shift:  No intake/output data recorded.   Bottom drain (marked #2): 20 mL serosanguinous fluid / 12 hrs Top drain (marked #1): 10 mL serosanguinous fluid / 12 hrs  Intake/Output last 2  shifts:  @IOLAST2SHIFTS @   Physical Exam:  Constitutional: alert, cooperative and no distress  Respiratory: breathing non-labored at rest  Cardiovascular: regular rate and sinus rhythm  Gastrointestinal: soft, non-tender, and non-distended with incision well-approximated without surrounding erythema or drainage, Right-sided ileostomy pink, healthy, and functioning well with thick dark green drainage, red rubber catheter support well-secured, LLQ drains x 2 with small volume serosanguinous fluid in both  Labs:  CBC Latest Ref Rng & Units 07/22/2017 07/22/2017 07/20/2017  WBC 3.6 - 11.0 K/uL 8.9 8.2 12.2(H)  Hemoglobin 12.0 - 16.0 g/dL 10.2(L) 10.4(L) 10.3(L)  Hematocrit 35.0 - 47.0 % 30.2(L) 29.7(L) 30.3(L)  Platelets 150 - 440 K/uL 333 355 337   CMP Latest Ref Rng & Units 07/22/2017 07/21/2017 07/20/2017  Glucose 65 - 99 mg/dL 200(H) 178(H) 181(H)  BUN 6 - 20 mg/dL 31(H) 30(H) 29(H)  Creatinine 0.44 - 1.00 mg/dL 1.09(H) 1.13(H) 1.32(H)  Sodium 135 - 145 mmol/L 131(L) 133(L) 135  Potassium 3.5 - 5.1 mmol/L 4.2 4.1 4.1  Chloride 101 - 111 mmol/L 101 103 106  CO2 22 - 32 mmol/L 24 27 25   Calcium 8.9 - 10.3 mg/dL 7.7(L) 7.6(L) 7.6(L)  Total Protein 6.5 - 8.1 g/dL 5.7(L) - -  Total Bilirubin 0.3 - 1.2 mg/dL 0.8 - -  Alkaline Phos 38 - 126 U/L 107 - -  AST 15 - 41 U/L 58(H) - -  ALT 14 - 54 U/L 44 - -    Assessment/Plan: (ICD-10's: (K57.20) 77 y.o. 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   - pain control as needed  - continue regular diet, discontinue TPN  - ileostomy supporting red rubber catheter removed   - will plan to remove bottom LLQ drain before discharge  - physical therapy, ambulation encouraged  - discharge planning for rehab  - DVT prophylaxis  All of the above findings and recommendations were discussed with the patient, patient's family, and the medical team, and all of patient's and family's questions were answered to their expressed satisfaction.  -- Marilynne Drivers [name], MD, [place]: [facility] - Partnering for exceptional care. Office: [phone]

## 2017-07-23 NOTE — Care Management Important Message (Signed)
Copy of signed IM left in patient's room.    

## 2017-07-23 NOTE — Consult Note (Addendum)
WOC Nurse ostomy follow up Stoma type/location: Dr. [name] in today to remove rod from loop ileostomy. Repouching required. Stomal assessment/size: 1 and 1/2 inch slightly oval, red, slightly raised Peristomal assessment: intact Treatment options for stomal/peristomal skin: skin barrier ring Output: soft brown stool  Ostomy pouching: 2pc., 2 and 3/4 inch flat pouching system with skin barrier ring Education provided: Plan continues for discharge to SNF for continuing rehabilitation services. Supplies taken to bedside and are in a plastic bag: 4 skin barriers, 4 pouches and 4 skin barrier rings.  May require convex pouching system if skin barrier ring fails to provide sufficient convexity. Enrolled patient in [place] Discharge program: Yes, previously by my associate, M. Austin on 4/24.   [facility] nursing team will not follow, but will remain available to this patient, the nursing and medical teams.  Please re-consult if needed. Thanks, Maudie Flakes, MSN, RN, [place], Ar[facility] Abbott  Pager# [phone]

## 2017-07-23 NOTE — Progress Notes (Signed)
OT Note  Patient Details Name: Brenda Gregory MRN: [id number] DOB: Feb 24, 1941   Followed up with the pt.in regards to the previous treatment session. Pt. Pleasantly declined the need to review any additional A/E use for LE self-care, or adaptive techniques. Pt. Was provided with a handout of A/E, and DME information. Pt's daughter's questions were answered about A/E, medical supply, and insurance coverage of the assistive devices.     Harrel Carina, MS, OTR/L 07/23/2017, 2:56 PM

## 2017-07-23 NOTE — Progress Notes (Signed)
Patients ostomy bag changed stoma looks good pink in color with small amount of stool emptied and documented to flowsheets.

## 2017-07-23 NOTE — Clinical Social Work Note (Signed)
CSW received phone call from [facility] that patient has been approved for SNF placement once she is medically ready for discharge and orders have been received.  Authorization is good until May 2nd, CSW to continue to follow patient's progress throughout discharge planning.  Jones Broom. Norval Morton, MSW, [place]  07/23/2017 6:25 PM

## 2017-07-24 DIAGNOSIS — I10 Essential (primary) hypertension: Secondary | ICD-10-CM | POA: Diagnosis not present

## 2017-07-24 DIAGNOSIS — E46 Unspecified protein-calorie malnutrition: Secondary | ICD-10-CM | POA: Diagnosis not present

## 2017-07-24 DIAGNOSIS — M109 Gout, unspecified: Secondary | ICD-10-CM | POA: Diagnosis not present

## 2017-07-24 DIAGNOSIS — Z9049 Acquired absence of other specified parts of digestive tract: Secondary | ICD-10-CM | POA: Diagnosis not present

## 2017-07-24 DIAGNOSIS — Z932 Ileostomy status: Secondary | ICD-10-CM | POA: Diagnosis not present

## 2017-07-24 DIAGNOSIS — D649 Anemia, unspecified: Secondary | ICD-10-CM | POA: Diagnosis not present

## 2017-07-24 DIAGNOSIS — M6281 Muscle weakness (generalized): Secondary | ICD-10-CM | POA: Diagnosis not present

## 2017-07-24 DIAGNOSIS — E119 Type 2 diabetes mellitus without complications: Secondary | ICD-10-CM | POA: Diagnosis not present

## 2017-07-24 DIAGNOSIS — R1902 Left upper quadrant abdominal swelling, mass and lump: Secondary | ICD-10-CM | POA: Diagnosis not present

## 2017-07-24 DIAGNOSIS — Z5189 Encounter for other specified aftercare: Secondary | ICD-10-CM | POA: Diagnosis not present

## 2017-07-24 DIAGNOSIS — Z743 Need for continuous supervision: Secondary | ICD-10-CM | POA: Diagnosis not present

## 2017-07-24 DIAGNOSIS — R2681 Unsteadiness on feet: Secondary | ICD-10-CM | POA: Diagnosis not present

## 2017-07-24 LAB — GLUCOSE, CAPILLARY
GLUCOSE-CAPILLARY: 119 mg/dL — AB (ref 65–99)
Glucose-Capillary: 115 mg/dL — ABNORMAL HIGH (ref 65–99)

## 2017-07-24 LAB — MAGNESIUM: Magnesium: 2.1 mg/dL (ref 1.7–2.4)

## 2017-07-24 MED ORDER — ENSURE ENLIVE PO LIQD: 237.0000 mL | Freq: Two times a day (BID) | ORAL | 12 refills | Status: DC

## 2017-07-24 MED ORDER — AMOXICILLIN-POT CLAVULANATE 875-125 MG PO TABS: 1.0000 | ORAL_TABLET | Freq: Two times a day (BID) | ORAL | 0 refills | Status: AC

## 2017-07-24 MED ORDER — OXYCODONE HCL 5 MG PO TABS: 5.0000 mg | ORAL_TABLET | ORAL | 0 refills | Status: DC | PRN

## 2017-07-24 NOTE — Progress Notes (Signed)
Pt was given D/C instructions and I called [facility] and gave them hand off instructions to [place]. VS were WDL and Iv, PICC line removed by protocol. I released her to EMS.

## 2017-07-24 NOTE — Discharge Instructions (Signed)
In addition to included general post-operative instructions for Open Colectomy, Ileostomy, and Surgical Drain Care,  Diet: Resume home heart healthy diet.   Activity: No heavy lifting >15 - 20 pounds (children, pets, laundry, garbage) or strenuous activity until follow-up, but light activity and walking are encouraged. Do not drive or drink alcohol if taking narcotic pain medications.  Wound care: Continue drain care and maintain a record/log of drainage volume and appearance as instructed by RN. You may shower/get incision wet with soapy water and pat dry (do not rub incisions), but no baths or submerging incision underwater until follow-up.   Medications: Resume all home medications. For mild to moderate pain: acetaminophen (Tylenol) or ibuprofen/naproxen (if no kidney disease). Combining Tylenol with alcohol can substantially increase your risk of causing liver disease. Narcotic pain medications, if prescribed, can be used for severe pain, though may cause nausea, constipation, and drowsiness. Do not combine Tylenol and Percocet (or similar) within a 6 hour period as Percocet (and similar) contain(s) Tylenol. If you do not need the narcotic pain medication, you do not need to fill the prescription.  Call office [phone]) at any time if any questions, worsening pain, fevers/chills, bleeding, drainage from incision site, or other concerns.

## 2017-07-24 NOTE — Clinical Social Work Note (Addendum)
CSW is aware that the patient is discharging today. CSW will deliver the discharge packet once the facility provides a room number and the discharge summary is available.. CSW is following.  Santiago Bumpers, MSW, [facility] [phone]

## 2017-07-24 NOTE — Discharge Summary (Signed)
Physician Discharge Summary  Patient ID: Brenda Gregory MRN: [id number] DOB/AGE: 1940/08/19 77 y.o.  Admit date: 07/15/2017 Discharge date: 07/24/2017  Admission Diagnoses:  Discharge Diagnoses:  Principal Problem:   Abscess of sigmoid colon Active Problems:   Diabetes ([place])   HTN (hypertension)   Gout   Acute renal failure superimposed on chronic kidney disease ([place])   Discharged Condition: fair  Hospital Course: 77 y.o. female presented [facility] ED for worsening LLQ abdominal pain along with poor appetite x 2 months, LLQ pain x 2 weeks, 15 lbs weight loss over past 1.5 weeks, brother who died due to colon cancer when he was 77 years old, and patient with no prior screening colonoscopy. Workup was found to be significant for CT imaging demonstrating a large LLQ abdominal wall abscess extending from patient's sigmoid colon. Differential diagnoses including colon cancer and perforated diverticulitis were discussed as were management options. Patient elected to proceed with surgical intervention, for which informed consent was obtained and documented, and patient underwent open Left colectomy with abscess drainage and diverting loop ileostomy [name], 4/21/202019). Due to patient's chronic malnutrition, planned surgery, and anticipated ileus and recovery, PICC was placed for TPN. Post-operatively, patient's pain and bowel function improved/resolved, and advancement of patient's diet was well-tolerated. Patient ambulated with PT, who recommended discharge to rehab. The remainder of patient's hospital course was essentially unremarkable, and discharge planning was initiated accordingly with patient safely able to be discharged to rehab with appropriate discharge instructions, antibiotics, pain control, and outpatient surgical follow-up after all of her and family's questions were answered to their expressed satisfaction. On patient's day of discharge, her bottom LLQ abscess drain was removed as  planned. Remaining drain and staples will be reassessed and likely removed at surgical follow-up.  Consults: Hospitalist (internal medicine)  Significant Diagnostic Studies: radiology: CT scan: Large LLQ abdominal wall abscess associated with sigmoid colon  Treatments: IV hydration, antibiotics: Zosyn and surgery: Open Left colectomy with creation of diverting loop ileostomy and placement of LLQ abdominal wall abscess drain (bottom) and LLQ pelvic/peri-anastomosis drain (top)  Discharge Exam: Blood pressure 133/70, pulse 91, temperature 97.9 F (36.6 C), temperature source Oral, resp. rate 20, height 5\' 5"  (1.651 m), weight 221 lb 1.9 oz (100.3 kg), SpO2 95 %. General appearance: alert, cooperative and no distress GI: abdomen soft and non-distended with minimal peri-incisional tenderness to palpation (transiently sore where bottom LLQ abscess drain removed), incisions well-approximated without surrounding erythema or drainage, one remaining LLQ (top) drain well-secured with serosanguinous fluid  Disposition: Discharge disposition: 03-Skilled Nursing Facility        Allergies as of 07/24/2017   No Known Allergies     Medication List    STOP taking these medications   ciprofloxacin 500 MG tablet Commonly known as:  CIPRO   metroNIDAZOLE 500 MG tablet Commonly known as:  FLAGYL     TAKE these medications   acetaminophen 650 MG CR tablet Commonly known as:  TYLENOL Take 650 mg by mouth as needed for pain.   allopurinol 300 MG tablet Commonly known as:  ZYLOPRIM Take 300 mg by mouth daily.   amoxicillin-clavulanate 875-125 MG tablet Commonly known as:  AUGMENTIN Take 1 tablet by mouth 2 (two) times daily for 3 days.   feeding supplement (ENSURE ENLIVE) Liqd Take 237 mLs by mouth 2 (two) times daily between meals.   lisinopril 20 MG tablet Commonly known as:  PRINIVIL,ZESTRIL Take 20 mg by mouth daily.   metFORMIN 500 MG tablet Commonly known as:  GLUCOPHAGE Take  500 mg by mouth daily with breakfast.   oxyCODONE 5 MG immediate release tablet Commonly known as:  Oxy IR/ROXICODONE Take 1-2 tablets (5-10 mg total) by mouth every 4 (four) hours as needed for severe pain.       Contact information for follow-up providers    Vickie Epley, MD. Schedule an appointment as soon as possible for a visit in 1 week(s).   Specialty:  General Surgery Contact information: [place] [address] [phone]            Contact information for after-discharge care    Destination    [facility] .   Service:  Skilled Nursing Contact information: [address] [place] [phone]                  Signed: Vickie Epley 07/24/2017, 12:31 PM

## 2017-07-26 LAB — GLUCOSE, CAPILLARY: GLUCOSE-CAPILLARY: 102 mg/dL — AB (ref 65–99)

## 2017-07-28 DIAGNOSIS — Z9049 Acquired absence of other specified parts of digestive tract: Secondary | ICD-10-CM | POA: Diagnosis not present

## 2017-07-28 DIAGNOSIS — Z932 Ileostomy status: Secondary | ICD-10-CM | POA: Diagnosis not present

## 2017-07-28 DIAGNOSIS — I10 Essential (primary) hypertension: Secondary | ICD-10-CM | POA: Diagnosis not present

## 2017-07-28 DIAGNOSIS — E119 Type 2 diabetes mellitus without complications: Secondary | ICD-10-CM | POA: Diagnosis not present

## 2017-07-28 DIAGNOSIS — M109 Gout, unspecified: Secondary | ICD-10-CM | POA: Diagnosis not present

## 2017-08-04 DIAGNOSIS — I10 Essential (primary) hypertension: Secondary | ICD-10-CM | POA: Diagnosis not present

## 2017-08-04 DIAGNOSIS — D649 Anemia, unspecified: Secondary | ICD-10-CM | POA: Diagnosis not present

## 2017-08-04 DIAGNOSIS — M109 Gout, unspecified: Secondary | ICD-10-CM | POA: Diagnosis not present

## 2017-08-04 DIAGNOSIS — E119 Type 2 diabetes mellitus without complications: Secondary | ICD-10-CM | POA: Diagnosis not present

## 2017-08-04 DIAGNOSIS — Z9049 Acquired absence of other specified parts of digestive tract: Secondary | ICD-10-CM | POA: Diagnosis not present

## 2017-08-04 DIAGNOSIS — Z932 Ileostomy status: Secondary | ICD-10-CM | POA: Diagnosis not present

## 2017-08-05 ENCOUNTER — Telehealth: Payer: Self-pay

## 2017-08-05 ENCOUNTER — Encounter: Payer: Self-pay | Admitting: Surgery

## 2017-08-05 ENCOUNTER — Ambulatory Visit (INDEPENDENT_AMBULATORY_CARE_PROVIDER_SITE_OTHER): Payer: Medicare HMO | Admitting: Surgery

## 2017-08-05 VITALS — BP 112/76 | HR 98 | Temp 97.8°F

## 2017-08-05 DIAGNOSIS — K5720 Diverticulitis of large intestine with perforation and abscess without bleeding: Secondary | ICD-10-CM

## 2017-08-05 DIAGNOSIS — Z4889 Encounter for other specified surgical aftercare: Secondary | ICD-10-CM

## 2017-08-05 NOTE — Patient Instructions (Addendum)
Please give [place] a call in case you have any questions or concerns.  We will send a message to our colostomy nurse to get in contact with you.  We will also send a referral to GI so they could schedule an appointment to be seen and to schedule a colonoscopy and then we will follow up with you.

## 2017-08-05 NOTE — Progress Notes (Signed)
Surgical Clinic Progress/Follow-up Note   HPI:  77 y.o. Female presents to clinic for post-op follow-up evaluation s/p sigmoid colectomy with creation drainage of large abdominal wall abscess and creation of diverting loop ileostomy in context of severe malnutrition and concern for malignancy requiring anticipated post-surgical radiation. Patient reports minimal peri-incisional pain and denies N/V, fever/chills, CP, or SOB with liquid to soft/pasty ileostomy drainage and tolerating more water and food than ileostomy drainage with post-surgical weight gain following 2 months of pre-surgical weight loss. Patient's only complaints are that she's being discharged from rehab, having met all of her pre-admission PT goals, and leakage from her ileostomy bags applied by post-hospital rehab. Also, despite having been advised to follow-up 1 week after hospital discharge for drain removal, today is 2 weeks later. She says her drain has been draining just 5 mL of clear brown liquid per day.  Review of Systems:  Constitutional: denies any other weight loss, fever, chills, or sweats  Eyes: denies any other vision changes, history of eye injury  ENT: denies sore throat, hearing problems  Respiratory: denies shortness of breath, wheezing  Cardiovascular: denies chest pain, palpitations  Gastrointestinal: abdominal pain, N/V,and bowel function as per HPI Musculoskeletal: denies any other joint pains or cramps  Skin: Denies any other rashes or skin discolorations  Neurological: denies any other headache, dizziness, weakness  Psychiatric: denies any other depression, anxiety  All other review of systems: otherwise negative   Vital Signs:  BP 112/76   Pulse 98   Temp 97.8 F (36.6 C) (Oral)   Physical Exam:  Constitutional:  -- Obese body habitus  -- Awake, alert, and oriented x3  Eyes:  -- Pupils equally round and reactive to light  -- No scleral icterus  Ear, nose, throat:  -- No jugular venous  distension  -- No nasal drainage, bleeding Pulmonary:  -- No crackles -- Equal breath sounds bilaterally -- Breathing non-labored at rest Cardiovascular:  -- S1, S2 present  -- No pericardial rubs  Gastrointestinal:  -- Soft, nontender, non-distended, no guarding/rebound -- Incisions well-approximated without surrounding erythema or drainage and LLQ surgical drain well-secured with pink viable and functional ileostomy with ostomy appliance currently not leaking -- No abdominal masses appreciated, pulsatile or otherwise  Musculoskeletal / Integumentary:  -- Wounds or skin discoloration: None appreciated except as described above (GI)  -- Extremities: B/L UE and LE FROM, hands and feet warm, no edema  Neurologic:  -- Motor function: intact and symmetric  -- Sensation: intact and symmetric   Assessment:  77 y.o. yo Female with a problem list including...  Patient Active Problem List   Diagnosis Date Noted  . Abscess of sigmoid colon 07/15/2017  . Diabetes ([place]) 07/15/2017  . HTN (hypertension) 07/15/2017  . Gout 07/15/2017  . Acute renal failure superimposed on chronic kidney disease ([place]) 07/15/2017  . CKD (chronic kidney disease) stage 3, GFR 30-59 ml/min (HCC) 01/21/2015  . Asthma, well controlled 01/21/2015  . Acute kidney injury (nontraumatic) ([place]) 01/13/2015    presents to clinic for post-op follow-up evaluation, doing overall well except reported ileostomy bag leaking 2 weeks s/p s/p sigmoid colectomy with creation drainage of large abdominal wall abscess and creation of diverting loop ileostomy in context of severe malnutrition and concern for malignancy requiring anticipated post-surgical radiation.  Plan:   - diet as tolerated  - okay to continue showering  - LLQ drain and all surgical skin staples removed  - non-cancer pathology results again discussed with patient and her family  -  okay to resume activities of daily living except no heavy lifting >40 lbs x 3 more  weeks  - requested wound ostomy RN to reach out to patient regarding ostomy appliance leaking  - GI referral for colonoscopy, anticipate to be done at least 6 weeks after surgery  - return to clinic after colonoscopy to discuss and schedule ileostomy reversal  - instructed to call office if any questions or concerns  All of the above recommendations were discussed with the patient and patient's family, and all of patient's and family's questions were answered to their expressed satisfaction.  -- Marilynne Drivers [name], MD, [place]: [facility] - Partnering for exceptional care. Office: [phone]

## 2017-08-05 NOTE — Telephone Encounter (Signed)
Patient called stating that when she got back to [facility] (Rehab location) she was told to ask her provider for an extension of stay. Patient then called her insurance company-[employer] to ask what she needed for her to have an extension of stay at [facility] and she was told that they needed for her provider to contact them. Patient then called me asking for [place] to contact her insurance so she could get an extension. Patient gave me her information and [employer]'s insurance number for [place] to call.  I then called and was transferred to 3 different departments and at the end, I was told that their nurse had contacted [facility] to see how they could extend patient's stay since they are the ones caring for the patient at this time. At the end, we were not able to provide any help. However, I told patient what had happened and if she needed additional help, that we were here. Patient understood and had no further questions.

## 2017-08-06 ENCOUNTER — Encounter: Payer: Self-pay | Admitting: Emergency Medicine

## 2017-08-06 ENCOUNTER — Emergency Department
Admission: EM | Admit: 2017-08-06 | Discharge: 2017-08-06 | Disposition: A | Discharge status: Home / Self Care | Payer: Medicare HMO | Attending: Student in an Organized Health Care Education/Training Program | Admitting: Student in an Organized Health Care Education/Training Program

## 2017-08-06 DIAGNOSIS — Z7189 Other specified counseling: Secondary | ICD-10-CM | POA: Diagnosis not present

## 2017-08-06 DIAGNOSIS — Z48815 Encounter for surgical aftercare following surgery on the digestive system: Secondary | ICD-10-CM | POA: Diagnosis not present

## 2017-08-06 DIAGNOSIS — E1122 Type 2 diabetes mellitus with diabetic chronic kidney disease: Secondary | ICD-10-CM | POA: Diagnosis not present

## 2017-08-06 DIAGNOSIS — M109 Gout, unspecified: Secondary | ICD-10-CM | POA: Diagnosis not present

## 2017-08-06 DIAGNOSIS — N183 Chronic kidney disease, stage 3 (moderate): Secondary | ICD-10-CM | POA: Insufficient documentation

## 2017-08-06 DIAGNOSIS — Z432 Encounter for attention to ileostomy: Secondary | ICD-10-CM | POA: Diagnosis not present

## 2017-08-06 DIAGNOSIS — Z433 Encounter for attention to colostomy: Secondary | ICD-10-CM | POA: Diagnosis not present

## 2017-08-06 DIAGNOSIS — J45909 Unspecified asthma, uncomplicated: Secondary | ICD-10-CM | POA: Insufficient documentation

## 2017-08-06 DIAGNOSIS — I10 Essential (primary) hypertension: Secondary | ICD-10-CM | POA: Diagnosis not present

## 2017-08-06 DIAGNOSIS — G8918 Other acute postprocedural pain: Secondary | ICD-10-CM

## 2017-08-06 DIAGNOSIS — K9413 Enterostomy malfunction: Secondary | ICD-10-CM | POA: Insufficient documentation

## 2017-08-06 DIAGNOSIS — Z87891 Personal history of nicotine dependence: Secondary | ICD-10-CM | POA: Diagnosis not present

## 2017-08-06 DIAGNOSIS — Z7984 Long term (current) use of oral hypoglycemic drugs: Secondary | ICD-10-CM | POA: Diagnosis not present

## 2017-08-06 DIAGNOSIS — Z96652 Presence of left artificial knee joint: Secondary | ICD-10-CM | POA: Diagnosis not present

## 2017-08-06 DIAGNOSIS — M1711 Unilateral primary osteoarthritis, right knee: Secondary | ICD-10-CM | POA: Diagnosis not present

## 2017-08-06 DIAGNOSIS — I129 Hypertensive chronic kidney disease with stage 1 through stage 4 chronic kidney disease, or unspecified chronic kidney disease: Secondary | ICD-10-CM | POA: Diagnosis not present

## 2017-08-06 DIAGNOSIS — Z79899 Other long term (current) drug therapy: Secondary | ICD-10-CM | POA: Insufficient documentation

## 2017-08-06 DIAGNOSIS — E119 Type 2 diabetes mellitus without complications: Secondary | ICD-10-CM | POA: Diagnosis not present

## 2017-08-06 LAB — BASIC METABOLIC PANEL
Anion gap: 9 (ref 5–15)
BUN: 22 mg/dL — ABNORMAL HIGH (ref 6–20)
CALCIUM: 9.1 mg/dL (ref 8.9–10.3)
CO2: 24 mmol/L (ref 22–32)
CREATININE: 1.55 mg/dL — AB (ref 0.44–1.00)
Chloride: 104 mmol/L (ref 101–111)
GFR, EST AFRICAN AMERICAN: 37 mL/min — AB (ref >60)
GFR, EST NON AFRICAN AMERICAN: 32 mL/min — AB (ref >60)
Glucose, Bld: 124 mg/dL — ABNORMAL HIGH (ref 65–99)
Potassium: 4.4 mmol/L (ref 3.5–5.1)
SODIUM: 137 mmol/L (ref 135–145)

## 2017-08-06 LAB — CBC WITH DIFFERENTIAL/PLATELET
BASOS PCT: 1 %
Basophils Absolute: 0.1 10*3/uL (ref 0–0.1)
EOS ABS: 0.3 10*3/uL (ref 0–0.7)
EOS PCT: 6 %
HCT: 33.9 % — ABNORMAL LOW (ref 35.0–47.0)
HEMOGLOBIN: 11.1 g/dL — AB (ref 12.0–16.0)
Lymphocytes Relative: 20 %
Lymphs Abs: 1.2 10*3/uL (ref 1.0–3.6)
MCH: 29.5 pg (ref 26.0–34.0)
MCHC: 32.8 g/dL (ref 32.0–36.0)
MCV: 90 fL (ref 80.0–100.0)
MONO ABS: 0.6 10*3/uL (ref 0.2–0.9)
MONOS PCT: 10 %
NEUTROS PCT: 63 %
Neutro Abs: 3.7 10*3/uL (ref 1.4–6.5)
PLATELETS: 288 10*3/uL (ref 150–440)
RBC: 3.77 MIL/uL — ABNORMAL LOW (ref 3.80–5.20)
RDW: 16.3 % — AB (ref 11.5–14.5)
WBC: 5.9 10*3/uL (ref 3.6–11.0)

## 2017-08-06 MED ORDER — SODIUM CHLORIDE 0.9 % IV BOLUS
500.0000 mL | Freq: Once | INTRAVENOUS | Status: AC
  Administered 2017-08-06: 500 mL via INTRAVENOUS

## 2017-08-06 NOTE — ED Triage Notes (Signed)
Patient presents to the ED with leaking ileostomy.  Patient went home today after being at [facility] for rehab.  Patient states today she has been changing her ostomy bag every couple of hours.  There is a large amount of yellow stool leaking under dressing at this time.

## 2017-08-06 NOTE — ED Provider Notes (Signed)
[facility] Emergency Department Provider Note    First MD Initiated Contact with Patient 08/06/17 1840     (approximate)  I have reviewed the triage vital signs and the nursing notes.   HISTORY  Chief Complaint Post-op Problem    HPI Brenda Gregory is a 77 y.o. female with recent discharge from rehab facility presents to the ER for evaluation of drainage from her ileostomy bag.  This is been progressively worsening and they are having trouble getting the ileostomy bags to stick.  Denies any fevers.  Was seen by general surgery clinic yesterday and had referral to wound care but has not seen them.  Got discharged home today and due to having to frequently change the ileostomy bag came to the ER for further evaluation.  She denies any fevers.  No new abdominal pain.  No chest pain or shortness of breath.  Past Medical History:  Diagnosis Date  . Arthritis    knees, right foot  . Asthma    as child  . Diabetes mellitus without complication ([place])   . Gout   . Hypertension    Family History  Problem Relation Age of Onset  . Dementia Mother   . Heart attack Father   . Colon cancer Brother    Past Surgical History:  Procedure Laterality Date  . ABDOMINAL HYSTERECTOMY    . CATARACT EXTRACTION W/PHACO Left 02/25/2016   Procedure: CATARACT EXTRACTION PHACO AND INTRAOCULAR LENS PLACEMENT (IOC);  Surgeon: Eulogio Bear, MD;  Location: [place];  Service: Ophthalmology;  Laterality: Left;  DIABETIC - oral meds LEFT  . CATARACT EXTRACTION W/PHACO Right 04/07/2016   Procedure: CATARACT EXTRACTION PHACO AND INTRAOCULAR LENS PLACEMENT (IOC);  Surgeon: Eulogio Bear, MD;  Location: [place];  Service: Ophthalmology;  Laterality: Right;  RIGHT DIABETES - oral meds IVA TOPICAL  . COLECTOMY WITH COLOSTOMY CREATION/HARTMANN PROCEDURE Left 07/18/2017   Procedure: COLECTOMY WITH loop ileostomy;  Surgeon: Vickie Epley, MD;  Location:  [facility];  Service: General;  Laterality: Left;  . KNEE SURGERY Left    Patient Active Problem List   Diagnosis Date Noted  . Abscess of sigmoid colon 07/15/2017  . Diabetes ([place]) 07/15/2017  . HTN (hypertension) 07/15/2017  . Gout 07/15/2017  . Acute renal failure superimposed on chronic kidney disease ([place]) 07/15/2017  . CKD (chronic kidney disease) stage 3, GFR 30-59 ml/min (HCC) 01/21/2015  . Asthma, well controlled 01/21/2015  . Acute kidney injury (nontraumatic) ([place]) 01/13/2015      Prior to Admission medications   Medication Sig Start Date End Date Taking? Authorizing Provider  acetaminophen (TYLENOL) 650 MG CR tablet Take 650 mg by mouth as needed for pain.    [provider]  allopurinol (ZYLOPRIM) 300 MG tablet Take 300 mg by mouth daily.    [provider]  feeding supplement, ENSURE ENLIVE, (ENSURE ENLIVE) LIQD Take 237 mLs by mouth 2 (two) times daily between meals. 07/24/17   Vickie Epley, MD  lisinopril (PRINIVIL,ZESTRIL) 20 MG tablet Take 20 mg by mouth daily.    [provider]  metFORMIN (GLUCOPHAGE) 500 MG tablet Take 500 mg by mouth daily with breakfast.    [provider]    Allergies Patient has no known allergies.    Social History Social History   Tobacco Use  . Smoking status: Former Smoker    Last attempt to quit: 03/30/1978    Years since quitting: 39.3  . Smokeless tobacco:  Never Used  Substance Use Topics  . Alcohol use: No  . Drug use: Not on file    Review of Systems Patient denies headaches, rhinorrhea, blurry vision, numbness, shortness of breath, chest pain, edema, cough, abdominal pain, nausea, vomiting, diarrhea, dysuria, fevers, rashes or hallucinations unless otherwise stated above in HPI. ____________________________________________   PHYSICAL EXAM:  VITAL SIGNS: Vitals:   08/06/17 1830 08/06/17 1905  BP: 125/72 129/64  Pulse: 82 88  Resp: 18   Temp: 98.9 F (37.2 C)   SpO2: 99%  99%    Constitutional: Alert and oriented. Well appearing and in no acute distress. Eyes: Conjunctivae are normal.  Head: Atraumatic. Nose: No congestion/rhinnorhea. Mouth/Throat: Mucous membranes are moist.   Neck: No stridor. Painless ROM.  Cardiovascular: Normal rate, regular rhythm. Grossly normal heart sounds.  Good peripheral circulation. Respiratory: Normal respiratory effort.  No retractions. Lungs CTAB. Gastrointestinal: Soft with well-healing midline laparotomy scars and a right ileostomy site with right lateral area of skin breakdown. no distention. No abdominal bruits. No CVA tenderness. Genitourinary:  Musculoskeletal: No lower extremity tenderness nor edema.  No joint effusions. Neurologic:  Normal speech and language. No gross focal neurologic deficits are appreciated. No facial droop Skin:  Skin is warm, dry and intact. No rash noted. Psychiatric: Mood and affect are normal. Speech and behavior are normal.  ____________________________________________   LABS (all labs ordered are listed, but only abnormal results are displayed)  Results for orders placed or performed during the hospital encounter of 08/06/17 (from the past 24 hour(s))  CBC with Differential/Platelet     Status: Abnormal   Collection Time: 08/06/17  6:57 PM  Result Value Ref Range   WBC 5.9 3.6 - 11.0 K/uL   RBC 3.77 (L) 3.80 - 5.20 MIL/uL   Hemoglobin 11.1 (L) 12.0 - 16.0 g/dL   HCT 33.9 (L) 35.0 - 47.0 %   MCV 90.0 80.0 - 100.0 fL   MCH 29.5 26.0 - 34.0 pg   MCHC 32.8 32.0 - 36.0 g/dL   RDW 16.3 (H) 11.5 - 14.5 %   Platelets 288 150 - 440 K/uL   Neutrophils Relative % 63 %   Neutro Abs 3.7 1.4 - 6.5 K/uL   Lymphocytes Relative 20 %   Lymphs Abs 1.2 1.0 - 3.6 K/uL   Monocytes Relative 10 %   Monocytes Absolute 0.6 0.2 - 0.9 K/uL   Eosinophils Relative 6 %   Eosinophils Absolute 0.3 0 - 0.7 K/uL   Basophils Relative 1 %   Basophils Absolute 0.1 0 - 0.1 K/uL  Basic metabolic panel      Status: Abnormal   Collection Time: 08/06/17  6:57 PM  Result Value Ref Range   Sodium 137 135 - 145 mmol/L   Potassium 4.4 3.5 - 5.1 mmol/L   Chloride 104 101 - 111 mmol/L   CO2 24 22 - 32 mmol/L   Glucose, Bld 124 (H) 65 - 99 mg/dL   BUN 22 (H) 6 - 20 mg/dL   Creatinine, Ser 1.55 (H) 0.44 - 1.00 mg/dL   Calcium 9.1 8.9 - 10.3 mg/dL   GFR calc non Af Amer 32 (L) >60 mL/min   GFR calc Af Amer 37 (L) >60 mL/min   Anion gap 9 5 - 15   ____________________________________________ ____________________________________________  RADIOLOGY   ____________________________________________   PROCEDURES  Procedure(s) performed:  Procedures    Critical Care performed: no ____________________________________________   INITIAL IMPRESSION / ASSESSMENT AND PLAN / ED COURSE  Pertinent labs &  imaging results that were available during my care of the patient were reviewed by me and considered in my medical decision making (see chart for details).  DDX: Cellulitis, dermatitis, skin breakdown, dehiscence  JANNELL FRANTA is a 77 y.o. who presents to the ED with symptoms as described above.  Blood work ordered to evaluate for significant dehydration fortunately shows no electrolyte abnormality.  Not clinically consistent with infectious process.  More likely contact dermatitis from persistent GI gastric fluid.  Spoke with Dr. [name] who saw patient yesterday.  Has been arranged for outpatient wound care and ostomy care.  At this point I do not believe patient requires hospitalization.  She was provided additional ostomy care supplies.  Stable and appropriate for outpatient management.      As part of my medical decision making, I reviewed the following data within the [facility] notes reviewed and incorporated, Labs reviewed, notes from prior ED visits.   ____________________________________________   FINAL CLINICAL IMPRESSION(S) / ED DIAGNOSES  Final  diagnoses:  Encounter for ostomy care education  Post-op pain      NEW MEDICATIONS STARTED DURING THIS VISIT:  New Prescriptions   No medications on file     Note:  This document was prepared using Dragon voice recognition software and may include unintentional dictation errors.    Merlyn Lot, MD 08/06/17 2059

## 2017-08-07 ENCOUNTER — Encounter: Payer: Self-pay | Admitting: Surgery

## 2017-08-07 DIAGNOSIS — Z7984 Long term (current) use of oral hypoglycemic drugs: Secondary | ICD-10-CM | POA: Diagnosis not present

## 2017-08-07 DIAGNOSIS — Z48815 Encounter for surgical aftercare following surgery on the digestive system: Secondary | ICD-10-CM | POA: Diagnosis not present

## 2017-08-07 DIAGNOSIS — M1711 Unilateral primary osteoarthritis, right knee: Secondary | ICD-10-CM | POA: Diagnosis not present

## 2017-08-07 DIAGNOSIS — Z432 Encounter for attention to ileostomy: Secondary | ICD-10-CM | POA: Diagnosis not present

## 2017-08-07 DIAGNOSIS — I10 Essential (primary) hypertension: Secondary | ICD-10-CM | POA: Diagnosis not present

## 2017-08-07 DIAGNOSIS — Z96652 Presence of left artificial knee joint: Secondary | ICD-10-CM | POA: Diagnosis not present

## 2017-08-07 DIAGNOSIS — M109 Gout, unspecified: Secondary | ICD-10-CM | POA: Diagnosis not present

## 2017-08-07 DIAGNOSIS — E119 Type 2 diabetes mellitus without complications: Secondary | ICD-10-CM | POA: Diagnosis not present

## 2017-08-07 DIAGNOSIS — K5720 Diverticulitis of large intestine with perforation and abscess without bleeding: Secondary | ICD-10-CM

## 2017-08-07 DIAGNOSIS — J45909 Unspecified asthma, uncomplicated: Secondary | ICD-10-CM | POA: Diagnosis not present

## 2017-08-07 HISTORY — DX: Diverticulitis of large intestine with perforation and abscess without bleeding: K57.20

## 2017-08-09 ENCOUNTER — Telehealth: Payer: Self-pay

## 2017-08-09 DIAGNOSIS — Z48815 Encounter for surgical aftercare following surgery on the digestive system: Secondary | ICD-10-CM | POA: Diagnosis not present

## 2017-08-09 DIAGNOSIS — I10 Essential (primary) hypertension: Secondary | ICD-10-CM | POA: Diagnosis not present

## 2017-08-09 DIAGNOSIS — M109 Gout, unspecified: Secondary | ICD-10-CM | POA: Diagnosis not present

## 2017-08-09 DIAGNOSIS — M1711 Unilateral primary osteoarthritis, right knee: Secondary | ICD-10-CM | POA: Diagnosis not present

## 2017-08-09 DIAGNOSIS — J45909 Unspecified asthma, uncomplicated: Secondary | ICD-10-CM | POA: Diagnosis not present

## 2017-08-09 DIAGNOSIS — Z7984 Long term (current) use of oral hypoglycemic drugs: Secondary | ICD-10-CM | POA: Diagnosis not present

## 2017-08-09 DIAGNOSIS — Z432 Encounter for attention to ileostomy: Secondary | ICD-10-CM | POA: Diagnosis not present

## 2017-08-09 DIAGNOSIS — E119 Type 2 diabetes mellitus without complications: Secondary | ICD-10-CM | POA: Diagnosis not present

## 2017-08-09 DIAGNOSIS — Z96652 Presence of left artificial knee joint: Secondary | ICD-10-CM | POA: Diagnosis not present

## 2017-08-09 NOTE — Telephone Encounter (Signed)
Returned call to patient . Scheduled follow up appointment 08/24/17 @ 2:15 am

## 2017-08-09 NOTE — Telephone Encounter (Signed)
-----   Message from Brenda Gregory sent at 08/06/2017  5:19 PM EDT ----- Regarding: VM left on 08/06/17 Patient left a message on voicemail to call back. Patient said she had questions from her last visit with [name] on 08/05/17. No specifics or details left.

## 2017-08-11 DIAGNOSIS — J45909 Unspecified asthma, uncomplicated: Secondary | ICD-10-CM | POA: Diagnosis not present

## 2017-08-11 DIAGNOSIS — M1711 Unilateral primary osteoarthritis, right knee: Secondary | ICD-10-CM | POA: Diagnosis not present

## 2017-08-11 DIAGNOSIS — Z432 Encounter for attention to ileostomy: Secondary | ICD-10-CM | POA: Diagnosis not present

## 2017-08-11 DIAGNOSIS — Z96652 Presence of left artificial knee joint: Secondary | ICD-10-CM | POA: Diagnosis not present

## 2017-08-11 DIAGNOSIS — E119 Type 2 diabetes mellitus without complications: Secondary | ICD-10-CM | POA: Diagnosis not present

## 2017-08-11 DIAGNOSIS — Z48815 Encounter for surgical aftercare following surgery on the digestive system: Secondary | ICD-10-CM | POA: Diagnosis not present

## 2017-08-11 DIAGNOSIS — M109 Gout, unspecified: Secondary | ICD-10-CM | POA: Diagnosis not present

## 2017-08-11 DIAGNOSIS — Z7984 Long term (current) use of oral hypoglycemic drugs: Secondary | ICD-10-CM | POA: Diagnosis not present

## 2017-08-11 DIAGNOSIS — I10 Essential (primary) hypertension: Secondary | ICD-10-CM | POA: Diagnosis not present

## 2017-08-12 ENCOUNTER — Ambulatory Visit (INDEPENDENT_AMBULATORY_CARE_PROVIDER_SITE_OTHER): Payer: Medicare HMO | Admitting: Gastroenterology

## 2017-08-12 ENCOUNTER — Ambulatory Visit: Payer: Medicare HMO | Admitting: Gastroenterology

## 2017-08-12 ENCOUNTER — Encounter: Payer: Self-pay | Admitting: Gastroenterology

## 2017-08-12 VITALS — BP 136/86 | HR 92 | Ht 65.0 in | Wt 199.8 lb

## 2017-08-12 DIAGNOSIS — Z1211 Encounter for screening for malignant neoplasm of colon: Secondary | ICD-10-CM | POA: Diagnosis not present

## 2017-08-12 DIAGNOSIS — K5720 Diverticulitis of large intestine with perforation and abscess without bleeding: Secondary | ICD-10-CM

## 2017-08-12 DIAGNOSIS — K5732 Diverticulitis of large intestine without perforation or abscess without bleeding: Secondary | ICD-10-CM

## 2017-08-12 MED ORDER — PEG 3350-KCL-NABCB-NACL-NASULF 236 G PO SOLR: 4000.0000 mL | Freq: Once | ORAL | 0 refills | Status: AC

## 2017-08-12 NOTE — Progress Notes (Signed)
Brenda Gregory [address]  [place]  [place], [place] [address]  Main: [phone]  Fax: [phone]   Gastroenterology Consultation  Referring Provider:     Vickie Epley, MD Primary Care Physician:  Derinda Late, MD Primary Gastroenterologist:  Dr. Vonda Gregory Reason for Consultation:     Evaluation for colonoscopy        HPI:    Chief Complaint  Patient presents with  . Diverticulitis  . Ileostomy    Brenda Gregory is a 77 y.o. y/o female referred for consultation & management  by Dr. Derinda Late, MD.  Patient here for evaluation of colonoscopy prior to ileostomy takedown.    Patient was recently in the hospital in April 2019 for peri-colonic abscess, and was seen by Dr. Marius Ditch in GI consultation placed for colonoscopy.  She had presented with abdominal pain to the hospital, which is completely resolved at this time.  CT showed 12.5 cm collection with gas along the left pelvic wall with connection to the mid sigmoid colon.  As per Dr. Verlin Grills evaluation, endoscopic evaluation in the setting of perforated pericolonic abscess was contraindicated, and endoscopy was not done.  Patient underwent surgery by Dr. [name] on April 21, with drainage of left lower quadrant abdominal wall abscess, takedown of splenic flexure, partial colectomy, creation of diverting loop ileostomy.  Pathology showed ruptured diverticulitis with abscess formation and direct extension to involve ovary.  Bowel resection margins viable.  During the hospitalization, she also had a CT guided drain placement since the abscess, which were removed later by Dr. [name].  Patient reports emptying her ileostomy bag multiple times a day, with no blood.  She reports some rectal discharge as well with no blood.  Is following closely with Dr. [name], with an upcoming appointment on May 28 as well.  No abdominal pain, no weight loss, no nausea or vomiting.  Patient has family history of colon  cancer in her brother, who died in his 69s from it.  She has never had a colonoscopy prior.  Past Medical History:  Diagnosis Date  . Arthritis    knees, right foot  . Asthma    as child  . Diabetes mellitus without complication ([place])   . Diverticulitis of large intestine with perforation and abscess without bleeding 08/07/2017  . Gout   . Hypertension     Past Surgical History:  Procedure Laterality Date  . ABDOMINAL HYSTERECTOMY    . CATARACT EXTRACTION W/PHACO Left 02/25/2016   Procedure: CATARACT EXTRACTION PHACO AND INTRAOCULAR LENS PLACEMENT (IOC);  Surgeon: Eulogio Bear, MD;  Location: [facility];  Service: Ophthalmology;  Laterality: Left;  DIABETIC - oral meds LEFT  . CATARACT EXTRACTION W/PHACO Right 04/07/2016   Procedure: CATARACT EXTRACTION PHACO AND INTRAOCULAR LENS PLACEMENT (IOC);  Surgeon: Eulogio Bear, MD;  Location: [facility];  Service: Ophthalmology;  Laterality: Right;  RIGHT DIABETES - oral meds IVA TOPICAL  . COLECTOMY WITH COLOSTOMY CREATION/HARTMANN PROCEDURE Left 07/18/2017   Procedure: COLECTOMY WITH loop ileostomy;  Surgeon: Brenda Epley, MD;  Location: [facility];  Service: General;  Laterality: Left;  . KNEE SURGERY Left     Prior to Admission medications   Medication Sig Start Date End Date Taking? Authorizing Provider  acetaminophen (TYLENOL) 650 MG CR tablet Take 650 mg by mouth as needed for pain.   Yes [provider]  allopurinol (ZYLOPRIM) 300 MG tablet Take 300 mg by mouth daily.   Yes [provider]  lisinopril (PRINIVIL,ZESTRIL) 20 MG tablet Take 20 mg by mouth daily.   Yes [provider]  metFORMIN (GLUCOPHAGE) 500 MG tablet Take 500 mg by mouth daily with breakfast.   Yes [provider]  feeding supplement, ENSURE ENLIVE, (ENSURE ENLIVE) LIQD Take 237 mLs by mouth 2 (two) times daily between meals. Patient not taking: Reported on 08/12/2017 07/24/17   Brenda Epley, MD     Family History  Problem Relation Age of Onset  . Dementia Mother   . Heart attack Father   . Colon cancer Brother      Social History   Tobacco Use  . Smoking status: Former Smoker    Last attempt to quit: 03/30/1978    Years since quitting: 39.3  . Smokeless tobacco: Never Used  Substance Use Topics  . Alcohol use: No  . Drug use: Never    Allergies as of 08/12/2017  . (No Known Allergies)    Review of Systems:    All systems reviewed and negative except where noted in HPI.   Physical Exam:  BP 136/86   Pulse 92   Ht 5\' 5"  (1.651 m)   Wt 199 lb 12.8 oz (90.6 kg)   BMI 33.25 kg/m  No LMP recorded. Patient has had a hysterectomy. Psych:  Alert and cooperative. Normal mood and affect. General:   Alert,  Well-developed, well-nourished, pleasant and cooperative in NAD Head:  Normocephalic and atraumatic. Eyes:  Sclera clear, no icterus.   Conjunctiva pink. Ears:  Normal auditory acuity. Nose:  No deformity, discharge, or lesions. Mouth:  No deformity or lesions,oropharynx pink & moist. Neck:  Supple; no masses or thyromegaly. Lungs:  Respirations even and unlabored.  Clear throughout to auscultation.   No wheezes, crackles, or rhonchi. No acute distress. Heart:  Regular rate and rhythm; no murmurs, clicks, rubs, or gallops. Abdomen:  Normal bowel sounds.  No bruits.  Soft, non-tender and non-distended without masses, hepatosplenomegaly or hernias noted.  No guarding or rebound tenderness.   Ileostomy bag in place, with green stool and no blood.  Incision site from recent surgery appears clean, without discharge. Msk:  Symmetrical without gross deformities. Good, equal movement & strength bilaterally. Pulses:  Normal pulses noted. Extremities:  No clubbing or edema.  No cyanosis. Neurologic:  Alert and oriented x3;  grossly normal neurologically. Skin:  Intact without significant lesions or rashes. No jaundice. Lymph Nodes:  No significant cervical adenopathy. Psych:   Alert and cooperative. Normal mood and affect.   Labs: CBC    Component Value Date/Time   WBC 5.9 08/06/2017 1857   RBC 3.77 (L) 08/06/2017 1857   HGB 11.1 (L) 08/06/2017 1857   HCT 33.9 (L) 08/06/2017 1857   PLT 288 08/06/2017 1857   MCV 90.0 08/06/2017 1857   MCH 29.5 08/06/2017 1857   MCHC 32.8 08/06/2017 1857   RDW 16.3 (H) 08/06/2017 1857   LYMPHSABS 1.2 08/06/2017 1857   MONOABS 0.6 08/06/2017 1857   EOSABS 0.3 08/06/2017 1857   BASOSABS 0.1 08/06/2017 1857   CMP     Component Value Date/Time   NA 137 08/06/2017 1857   K 4.4 08/06/2017 1857   CL 104 08/06/2017 1857   CO2 24 08/06/2017 1857   GLUCOSE 124 (H) 08/06/2017 1857   BUN 22 (H) 08/06/2017 1857   CREATININE 1.55 (H) 08/06/2017 1857   CALCIUM 9.1 08/06/2017 1857   PROT 5.7 (L) 07/22/2017 0730   ALBUMIN 2.0 (L) 07/22/2017 0730   AST  58 (H) 07/22/2017 0730   ALT 44 07/22/2017 0730   ALKPHOS 107 07/22/2017 0730   BILITOT 0.8 07/22/2017 0730   GFRNONAA 32 (L) 08/06/2017 1857   GFRAA 37 (L) 08/06/2017 1857    Imaging Studies: Ct Abdomen Pelvis Wo Contrast  Result Date: 07/15/2017 CLINICAL DATA:  77 year old female with acute LEFT abdominal and pelvic pain for 10 days. EXAM: CT ABDOMEN AND PELVIS WITHOUT CONTRAST TECHNIQUE: Multidetector CT imaging of the abdomen and pelvis was performed following the standard protocol without IV contrast. COMPARISON:  None. FINDINGS: Please note that parenchymal abnormalities may be missed without intravenous contrast. Lower chest: No acute abnormality Hepatobiliary: Mild hepatic steatosis noted without focal hepatic abnormality. Cholelithiasis identified without CT evidence of acute cholecystitis. No biliary dilatation. Pancreas: Atrophic without other significant abnormality. Spleen: Unremarkable Adrenals/Urinary Tract: Bilateral renal atrophy identified. No hydronephrosis, renal calculi or adrenal abnormality. The bladder is unremarkable. Stomach/Bowel: A 9.1 x 6.5 x 12.5 cm  collection with gas along the low LEFT pelvic wall is compatible with an abscess. There appears to be a connection of this abscess to the mid sigmoid colon. There is no evidence of bowel obstruction. Colonic diverticulosis noted. The appendix is normal. Vascular/Lymphatic: Aortic atherosclerosis. No enlarged abdominal or pelvic lymph nodes. Reproductive: A 3.8 cm RIGHT ovarian cyst is present. The LEFT ovary is unremarkable. Patient is status post hysterectomy. Other: No ascites or pneumoperitoneum. Musculoskeletal: No acute or suspicious bony abnormalities. Degenerative changes within the lumbar spine are noted. IMPRESSION: 1. 9.1 x 6.5 x 12.5 cm abscess along the low LEFT pelvic wall with apparent connection to the mid sigmoid colon which is likely the source. 2. 3.8 cm RIGHT ovarian cyst. Pelvic ultrasound evaluation recommended. 3. Hepatic steatosis. 4. Cholelithiasis. 5.  Aortic Atherosclerosis (ICD10-I70.0). Electronically Signed   By: Margarette Canada M.D.   On: 07/15/2017 16:20   Ct Image Guided Fluid Drain By Catheter  Result Date: 07/16/2017 INDICATION: Left lower quadrant diverticular abscess extending into the abdominal wall EXAM: CT GUIDED DRAINAGE OF DIVERTICULAR ABSCESS MEDICATIONS: The patient is currently admitted to the hospital and receiving intravenous antibiotics. The antibiotics were administered within an appropriate time frame prior to the initiation of the procedure. ANESTHESIA/SEDATION: 0.5 mg IV Versed 50 mcg IV Fentanyl Moderate Sedation Time:  13 MINUTES The patient was continuously monitored during the procedure by the interventional radiology nurse under my direct supervision. COMPLICATIONS: None immediate. TECHNIQUE: Informed written consent was obtained from the patient after a thorough discussion of the procedural risks, benefits and alternatives. All questions were addressed. Maximal Sterile Barrier Technique was utilized including caps, mask, sterile gowns, sterile gloves, sterile  drape, hand hygiene and skin antiseptic. A timeout was performed prior to the initiation of the procedure. PROCEDURE: previous imaging reviewed. patient positioned supine. noncontrast localization ct performed. the left lower quadrant diverticular abscess extending into the abdominal wall was localized and marked. under sterile conditions and local anesthesia, an 18 gauge needle was advanced from a left oblique approach into the abscess. needle position confirmed with ct. amplatz guidewire inserted followed by tract dilatation to insert a 10 french drain. drain catheter position confirmed with ct. syringe aspiration yielded fecal contaminated fluid. sample sent culture. catheter secured with prolene suture and a sterile dressing. external suction bulb connected. no immediate complication. patient tolerated the procedure well. FINDINGS: CT imaging confirms percutaneous needle access of the left lower quadrant diverticular abscess for drain insertion IMPRESSION: Successful CT-guided diverticular abscess drain as above. Electronically Signed   By: Jerilynn Mages.  Shick M.D.   On: 07/16/2017 15:34   [place] Ekg Site Rite  Result Date: 07/17/2017 If Site Rite image not attached, placement could not be confirmed due to current cardiac rhythm.   Assessment and Plan:   SHAVONTE ZHAO is a 77 y.o. y/o female has been referred for evaluation for colonoscopy prior to ileostomy takedown, with diverting loop ileostomy, and sigmoid colectomy with colorectal anastomosis done on April 21 due to ruptured diverticulitis with abscess formation  Patient doing well postsurgically Colonoscopy indicated to rule out any colonic lesions prior to ileostomy reversal, and for high risk screening given that she has never had one, and has family history of colon cancer Will wait at least 6 weeks out from her recent surgery prior to procedure We will schedule at this time for at least 6 weeks out from her surgery If things change prior to her  procedure, and her family were asked to contact us and they verbalized understanding.  That is if she develops new symptoms, including abdominal pain, blood in stool, changes in bowel habits or any other reason for concern, she was asked to notify us.  I have discussed alternative options, risks & benefits,  which include, but are not limited to, bleeding, infection, perforation,respiratory complication & drug reaction.  The patient agrees with this plan & written consent will be obtained.     Dr Brenda Gregory

## 2017-08-13 DIAGNOSIS — Z7984 Long term (current) use of oral hypoglycemic drugs: Secondary | ICD-10-CM | POA: Diagnosis not present

## 2017-08-13 DIAGNOSIS — J45909 Unspecified asthma, uncomplicated: Secondary | ICD-10-CM | POA: Diagnosis not present

## 2017-08-13 DIAGNOSIS — E119 Type 2 diabetes mellitus without complications: Secondary | ICD-10-CM | POA: Diagnosis not present

## 2017-08-13 DIAGNOSIS — Z96652 Presence of left artificial knee joint: Secondary | ICD-10-CM | POA: Diagnosis not present

## 2017-08-13 DIAGNOSIS — Z432 Encounter for attention to ileostomy: Secondary | ICD-10-CM | POA: Diagnosis not present

## 2017-08-13 DIAGNOSIS — M1711 Unilateral primary osteoarthritis, right knee: Secondary | ICD-10-CM | POA: Diagnosis not present

## 2017-08-13 DIAGNOSIS — Z48815 Encounter for surgical aftercare following surgery on the digestive system: Secondary | ICD-10-CM | POA: Diagnosis not present

## 2017-08-13 DIAGNOSIS — M109 Gout, unspecified: Secondary | ICD-10-CM | POA: Diagnosis not present

## 2017-08-13 DIAGNOSIS — I10 Essential (primary) hypertension: Secondary | ICD-10-CM | POA: Diagnosis not present

## 2017-08-16 DIAGNOSIS — M109 Gout, unspecified: Secondary | ICD-10-CM | POA: Diagnosis not present

## 2017-08-16 DIAGNOSIS — Z7984 Long term (current) use of oral hypoglycemic drugs: Secondary | ICD-10-CM | POA: Diagnosis not present

## 2017-08-16 DIAGNOSIS — I10 Essential (primary) hypertension: Secondary | ICD-10-CM | POA: Diagnosis not present

## 2017-08-16 DIAGNOSIS — Z48815 Encounter for surgical aftercare following surgery on the digestive system: Secondary | ICD-10-CM | POA: Diagnosis not present

## 2017-08-16 DIAGNOSIS — Z432 Encounter for attention to ileostomy: Secondary | ICD-10-CM | POA: Diagnosis not present

## 2017-08-16 DIAGNOSIS — Z96652 Presence of left artificial knee joint: Secondary | ICD-10-CM | POA: Diagnosis not present

## 2017-08-16 DIAGNOSIS — E119 Type 2 diabetes mellitus without complications: Secondary | ICD-10-CM | POA: Diagnosis not present

## 2017-08-16 DIAGNOSIS — J45909 Unspecified asthma, uncomplicated: Secondary | ICD-10-CM | POA: Diagnosis not present

## 2017-08-16 DIAGNOSIS — M1711 Unilateral primary osteoarthritis, right knee: Secondary | ICD-10-CM | POA: Diagnosis not present

## 2017-08-18 DIAGNOSIS — M1711 Unilateral primary osteoarthritis, right knee: Secondary | ICD-10-CM | POA: Diagnosis not present

## 2017-08-18 DIAGNOSIS — Z48815 Encounter for surgical aftercare following surgery on the digestive system: Secondary | ICD-10-CM | POA: Diagnosis not present

## 2017-08-18 DIAGNOSIS — Z432 Encounter for attention to ileostomy: Secondary | ICD-10-CM | POA: Diagnosis not present

## 2017-08-18 DIAGNOSIS — J45909 Unspecified asthma, uncomplicated: Secondary | ICD-10-CM | POA: Diagnosis not present

## 2017-08-18 DIAGNOSIS — E119 Type 2 diabetes mellitus without complications: Secondary | ICD-10-CM | POA: Diagnosis not present

## 2017-08-18 DIAGNOSIS — Z96652 Presence of left artificial knee joint: Secondary | ICD-10-CM | POA: Diagnosis not present

## 2017-08-18 DIAGNOSIS — I10 Essential (primary) hypertension: Secondary | ICD-10-CM | POA: Diagnosis not present

## 2017-08-18 DIAGNOSIS — Z7984 Long term (current) use of oral hypoglycemic drugs: Secondary | ICD-10-CM | POA: Diagnosis not present

## 2017-08-18 DIAGNOSIS — M109 Gout, unspecified: Secondary | ICD-10-CM | POA: Diagnosis not present

## 2017-08-20 ENCOUNTER — Ambulatory Visit: Admission: RE | Admit: 2017-08-20 | Payer: Medicare HMO | Source: Ambulatory Visit | Admitting: Gastroenterology

## 2017-08-20 ENCOUNTER — Encounter: Admission: RE | Payer: Self-pay | Source: Ambulatory Visit

## 2017-08-20 SURGERY — COLONOSCOPY WITH PROPOFOL
Anesthesia: General

## 2017-08-24 ENCOUNTER — Encounter: Payer: Self-pay | Admitting: Surgery

## 2017-08-26 DIAGNOSIS — M109 Gout, unspecified: Secondary | ICD-10-CM | POA: Diagnosis not present

## 2017-08-26 DIAGNOSIS — Z48815 Encounter for surgical aftercare following surgery on the digestive system: Secondary | ICD-10-CM | POA: Diagnosis not present

## 2017-08-26 DIAGNOSIS — J45909 Unspecified asthma, uncomplicated: Secondary | ICD-10-CM | POA: Diagnosis not present

## 2017-08-26 DIAGNOSIS — M1711 Unilateral primary osteoarthritis, right knee: Secondary | ICD-10-CM | POA: Diagnosis not present

## 2017-08-26 DIAGNOSIS — I10 Essential (primary) hypertension: Secondary | ICD-10-CM | POA: Diagnosis not present

## 2017-08-26 DIAGNOSIS — Z96652 Presence of left artificial knee joint: Secondary | ICD-10-CM | POA: Diagnosis not present

## 2017-08-26 DIAGNOSIS — Z432 Encounter for attention to ileostomy: Secondary | ICD-10-CM | POA: Diagnosis not present

## 2017-08-26 DIAGNOSIS — E119 Type 2 diabetes mellitus without complications: Secondary | ICD-10-CM | POA: Diagnosis not present

## 2017-08-26 DIAGNOSIS — Z7984 Long term (current) use of oral hypoglycemic drugs: Secondary | ICD-10-CM | POA: Diagnosis not present

## 2017-08-30 DIAGNOSIS — N183 Chronic kidney disease, stage 3 (moderate): Secondary | ICD-10-CM | POA: Diagnosis not present

## 2017-08-30 DIAGNOSIS — E119 Type 2 diabetes mellitus without complications: Secondary | ICD-10-CM | POA: Diagnosis not present

## 2017-08-30 DIAGNOSIS — I10 Essential (primary) hypertension: Secondary | ICD-10-CM | POA: Diagnosis not present

## 2017-08-30 DIAGNOSIS — Z79899 Other long term (current) drug therapy: Secondary | ICD-10-CM | POA: Diagnosis not present

## 2017-08-30 DIAGNOSIS — K5720 Diverticulitis of large intestine with perforation and abscess without bleeding: Secondary | ICD-10-CM | POA: Diagnosis not present

## 2017-09-03 DIAGNOSIS — Z432 Encounter for attention to ileostomy: Secondary | ICD-10-CM | POA: Diagnosis not present

## 2017-09-03 DIAGNOSIS — Z96652 Presence of left artificial knee joint: Secondary | ICD-10-CM | POA: Diagnosis not present

## 2017-09-03 DIAGNOSIS — M1711 Unilateral primary osteoarthritis, right knee: Secondary | ICD-10-CM | POA: Diagnosis not present

## 2017-09-03 DIAGNOSIS — Z7984 Long term (current) use of oral hypoglycemic drugs: Secondary | ICD-10-CM | POA: Diagnosis not present

## 2017-09-03 DIAGNOSIS — I10 Essential (primary) hypertension: Secondary | ICD-10-CM | POA: Diagnosis not present

## 2017-09-03 DIAGNOSIS — J45909 Unspecified asthma, uncomplicated: Secondary | ICD-10-CM | POA: Diagnosis not present

## 2017-09-03 DIAGNOSIS — E119 Type 2 diabetes mellitus without complications: Secondary | ICD-10-CM | POA: Diagnosis not present

## 2017-09-03 DIAGNOSIS — Z48815 Encounter for surgical aftercare following surgery on the digestive system: Secondary | ICD-10-CM | POA: Diagnosis not present

## 2017-09-03 DIAGNOSIS — M109 Gout, unspecified: Secondary | ICD-10-CM | POA: Diagnosis not present

## 2017-09-15 DIAGNOSIS — Z48815 Encounter for surgical aftercare following surgery on the digestive system: Secondary | ICD-10-CM | POA: Diagnosis not present

## 2017-09-15 DIAGNOSIS — I10 Essential (primary) hypertension: Secondary | ICD-10-CM | POA: Diagnosis not present

## 2017-09-15 DIAGNOSIS — M109 Gout, unspecified: Secondary | ICD-10-CM | POA: Diagnosis not present

## 2017-09-15 DIAGNOSIS — Z96652 Presence of left artificial knee joint: Secondary | ICD-10-CM | POA: Diagnosis not present

## 2017-09-15 DIAGNOSIS — Z432 Encounter for attention to ileostomy: Secondary | ICD-10-CM | POA: Diagnosis not present

## 2017-09-15 DIAGNOSIS — E119 Type 2 diabetes mellitus without complications: Secondary | ICD-10-CM | POA: Diagnosis not present

## 2017-09-15 DIAGNOSIS — J45909 Unspecified asthma, uncomplicated: Secondary | ICD-10-CM | POA: Diagnosis not present

## 2017-09-15 DIAGNOSIS — M1711 Unilateral primary osteoarthritis, right knee: Secondary | ICD-10-CM | POA: Diagnosis not present

## 2017-09-15 DIAGNOSIS — Z7984 Long term (current) use of oral hypoglycemic drugs: Secondary | ICD-10-CM | POA: Diagnosis not present

## 2017-09-16 ENCOUNTER — Other Ambulatory Visit: Payer: Self-pay

## 2017-09-20 ENCOUNTER — Encounter: Payer: Self-pay | Admitting: Anesthesiology

## 2017-09-20 ENCOUNTER — Ambulatory Visit: Payer: Medicare HMO | Admitting: Anesthesiology

## 2017-09-20 ENCOUNTER — Ambulatory Visit
Admission: RE | Admit: 2017-09-20 | Discharge: 2017-09-20 | Disposition: A | Discharge status: Home / Self Care | Payer: Medicare HMO | Source: Ambulatory Visit | Attending: Gastroenterology | Admitting: Gastroenterology

## 2017-09-20 ENCOUNTER — Encounter
Admission: RE | Disposition: A | Discharge status: Home / Self Care | Payer: Self-pay | Source: Ambulatory Visit | Attending: Gastroenterology

## 2017-09-20 DIAGNOSIS — D120 Benign neoplasm of cecum: Secondary | ICD-10-CM

## 2017-09-20 DIAGNOSIS — Z09 Encounter for follow-up examination after completed treatment for conditions other than malignant neoplasm: Secondary | ICD-10-CM | POA: Insufficient documentation

## 2017-09-20 DIAGNOSIS — Z79899 Other long term (current) drug therapy: Secondary | ICD-10-CM | POA: Diagnosis not present

## 2017-09-20 DIAGNOSIS — D122 Benign neoplasm of ascending colon: Secondary | ICD-10-CM

## 2017-09-20 DIAGNOSIS — Z8719 Personal history of other diseases of the digestive system: Secondary | ICD-10-CM | POA: Insufficient documentation

## 2017-09-20 DIAGNOSIS — K648 Other hemorrhoids: Secondary | ICD-10-CM | POA: Diagnosis not present

## 2017-09-20 DIAGNOSIS — Z8249 Family history of ischemic heart disease and other diseases of the circulatory system: Secondary | ICD-10-CM | POA: Diagnosis not present

## 2017-09-20 DIAGNOSIS — K635 Polyp of colon: Secondary | ICD-10-CM | POA: Diagnosis not present

## 2017-09-20 DIAGNOSIS — Z9049 Acquired absence of other specified parts of digestive tract: Secondary | ICD-10-CM

## 2017-09-20 DIAGNOSIS — E119 Type 2 diabetes mellitus without complications: Secondary | ICD-10-CM | POA: Insufficient documentation

## 2017-09-20 DIAGNOSIS — K6389 Other specified diseases of intestine: Secondary | ICD-10-CM

## 2017-09-20 DIAGNOSIS — K5792 Diverticulitis of intestine, part unspecified, without perforation or abscess without bleeding: Secondary | ICD-10-CM | POA: Diagnosis not present

## 2017-09-20 DIAGNOSIS — Z87891 Personal history of nicotine dependence: Secondary | ICD-10-CM | POA: Insufficient documentation

## 2017-09-20 DIAGNOSIS — Z1211 Encounter for screening for malignant neoplasm of colon: Secondary | ICD-10-CM | POA: Diagnosis not present

## 2017-09-20 DIAGNOSIS — K5790 Diverticulosis of intestine, part unspecified, without perforation or abscess without bleeding: Secondary | ICD-10-CM | POA: Diagnosis not present

## 2017-09-20 DIAGNOSIS — K5730 Diverticulosis of large intestine without perforation or abscess without bleeding: Secondary | ICD-10-CM | POA: Diagnosis not present

## 2017-09-20 DIAGNOSIS — I10 Essential (primary) hypertension: Secondary | ICD-10-CM | POA: Insufficient documentation

## 2017-09-20 DIAGNOSIS — J45909 Unspecified asthma, uncomplicated: Secondary | ICD-10-CM | POA: Diagnosis not present

## 2017-09-20 DIAGNOSIS — M13862 Other specified arthritis, left knee: Secondary | ICD-10-CM | POA: Diagnosis not present

## 2017-09-20 DIAGNOSIS — Z01818 Encounter for other preprocedural examination: Secondary | ICD-10-CM

## 2017-09-20 DIAGNOSIS — Z7984 Long term (current) use of oral hypoglycemic drugs: Secondary | ICD-10-CM | POA: Insufficient documentation

## 2017-09-20 DIAGNOSIS — D123 Benign neoplasm of transverse colon: Secondary | ICD-10-CM

## 2017-09-20 DIAGNOSIS — K5289 Other specified noninfective gastroenteritis and colitis: Secondary | ICD-10-CM | POA: Diagnosis not present

## 2017-09-20 DIAGNOSIS — Z8709 Personal history of other diseases of the respiratory system: Secondary | ICD-10-CM | POA: Diagnosis not present

## 2017-09-20 DIAGNOSIS — Z980 Intestinal bypass and anastomosis status: Secondary | ICD-10-CM | POA: Diagnosis not present

## 2017-09-20 DIAGNOSIS — M109 Gout, unspecified: Secondary | ICD-10-CM | POA: Insufficient documentation

## 2017-09-20 DIAGNOSIS — N183 Chronic kidney disease, stage 3 (moderate): Secondary | ICD-10-CM | POA: Diagnosis not present

## 2017-09-20 DIAGNOSIS — M13861 Other specified arthritis, right knee: Secondary | ICD-10-CM | POA: Diagnosis not present

## 2017-09-20 DIAGNOSIS — I129 Hypertensive chronic kidney disease with stage 1 through stage 4 chronic kidney disease, or unspecified chronic kidney disease: Secondary | ICD-10-CM | POA: Diagnosis not present

## 2017-09-20 DIAGNOSIS — Z932 Ileostomy status: Secondary | ICD-10-CM | POA: Insufficient documentation

## 2017-09-20 DIAGNOSIS — K529 Noninfective gastroenteritis and colitis, unspecified: Secondary | ICD-10-CM | POA: Insufficient documentation

## 2017-09-20 DIAGNOSIS — E1122 Type 2 diabetes mellitus with diabetic chronic kidney disease: Secondary | ICD-10-CM | POA: Diagnosis not present

## 2017-09-20 HISTORY — PX: COLONOSCOPY WITH PROPOFOL: SHX5780

## 2017-09-20 SURGERY — COLONOSCOPY WITH PROPOFOL
Anesthesia: General

## 2017-09-20 MED ORDER — EPHEDRINE SULFATE 50 MG/ML IJ SOLN
INTRAMUSCULAR | Status: AC
  Filled 2017-09-20: qty 1

## 2017-09-20 MED ORDER — FENTANYL CITRATE (PF) 100 MCG/2ML IJ SOLN
INTRAMUSCULAR | Status: DC | PRN
  Administered 2017-09-20: 50 ug via INTRAVENOUS

## 2017-09-20 MED ORDER — PHENYLEPHRINE HCL 10 MG/ML IJ SOLN
INTRAMUSCULAR | Status: AC
  Filled 2017-09-20: qty 1

## 2017-09-20 MED ORDER — MIDAZOLAM HCL 2 MG/2ML IJ SOLN
INTRAMUSCULAR | Status: AC
  Filled 2017-09-20: qty 2

## 2017-09-20 MED ORDER — SODIUM CHLORIDE 0.9 % IV SOLN
INTRAVENOUS | Status: DC
  Administered 2017-09-20: 13:00:00 via INTRAVENOUS

## 2017-09-20 MED ORDER — PHENYLEPHRINE HCL 10 MG/ML IJ SOLN
INTRAMUSCULAR | Status: DC | PRN
  Administered 2017-09-20 (×2): 100 ug via INTRAVENOUS
  Administered 2017-09-20 (×2): 200 ug via INTRAVENOUS
  Administered 2017-09-20: 100 ug via INTRAVENOUS

## 2017-09-20 MED ORDER — PROPOFOL 500 MG/50ML IV EMUL
INTRAVENOUS | Status: AC
  Filled 2017-09-20: qty 50

## 2017-09-20 MED ORDER — MIDAZOLAM HCL 2 MG/2ML IJ SOLN
INTRAMUSCULAR | Status: DC | PRN
  Administered 2017-09-20: 1 mg via INTRAVENOUS

## 2017-09-20 MED ORDER — LIDOCAINE HCL (PF) 1 % IJ SOLN
INTRAMUSCULAR | Status: AC
  Filled 2017-09-20: qty 2

## 2017-09-20 MED ORDER — PROPOFOL 500 MG/50ML IV EMUL
INTRAVENOUS | Status: DC | PRN
  Administered 2017-09-20: 100 ug/kg/min via INTRAVENOUS

## 2017-09-20 MED ORDER — FENTANYL CITRATE (PF) 100 MCG/2ML IJ SOLN
INTRAMUSCULAR | Status: AC
  Filled 2017-09-20: qty 2

## 2017-09-20 MED ORDER — EPHEDRINE SULFATE 50 MG/ML IJ SOLN
INTRAMUSCULAR | Status: DC | PRN
  Administered 2017-09-20 (×2): 5 mg via INTRAVENOUS

## 2017-09-20 NOTE — Anesthesia Postprocedure Evaluation (Signed)
Anesthesia Post Note  Patient: Brenda Gregory  Procedure(s) Performed: COLONOSCOPY WITH PROPOFOL (N/A )  Patient location during evaluation: Endoscopy Anesthesia Type: General Level of consciousness: awake and alert Pain management: pain level controlled Vital Signs Assessment: post-procedure vital signs reviewed and stable Respiratory status: spontaneous breathing, nonlabored ventilation, respiratory function stable and patient connected to nasal cannula oxygen Cardiovascular status: blood pressure returned to baseline and stable Postop Assessment: no apparent nausea or vomiting Anesthetic complications: no     Last Vitals:  Vitals:   09/20/17 1251 09/20/17 1416  BP: 100/74 (!) 87/58  Pulse: 100 76  Resp: 18 17  Temp: (!) 35.8 C (!) 35.6 C  SpO2: 100% 100%    Last Pain:  Vitals:   09/20/17 1416  TempSrc: Tympanic  PainSc: 0-No pain                 Rorie Delmore S

## 2017-09-20 NOTE — Anesthesia Post-op Follow-up Note (Signed)
Anesthesia QCDR form completed.        

## 2017-09-20 NOTE — Transfer of Care (Signed)
     Immediate Anesthesia Transfer of Care Note  Patient: Brenda Gregory  Procedure(s) Performed: COLONOSCOPY WITH PROPOFOL (N/A )  Patient Location: PACU  Anesthesia Type:General  Level of Consciousness: awake and sedated  Airway & Oxygen Therapy: Patient Spontanous Breathing and Patient connected to nasal cannula oxygen  Post-op Assessment: Report given to RN and Post -op Vital signs reviewed and stable  Post vital signs: Reviewed and stable  Last Vitals:  Vitals Value Taken Time  BP    Temp    Pulse    Resp    SpO2      Last Pain:  Vitals:   09/20/17 1251  TempSrc: Tympanic  PainSc: 0-No pain         Complications: No apparent anesthesia complications

## 2017-09-20 NOTE — Anesthesia Procedure Notes (Signed)
Performed by: Cook-Martin, Kashay Cavenaugh Pre-anesthesia Checklist: Patient identified, Emergency Drugs available, Suction available, Patient being monitored and Timeout performed Patient Re-evaluated:Patient Re-evaluated prior to induction Oxygen Delivery Method: Nasal cannula Preoxygenation: Pre-oxygenation with 100% oxygen Induction Type: IV induction Placement Confirmation: positive ETCO2 and CO2 detector       

## 2017-09-20 NOTE — Op Note (Signed)
[facility] Gastroenterology Patient Name: Brenda Gregory Procedure Date: 09/20/2017 1:22 PM MRN: [id number] Account #: [id number] Date of Birth: [birth year] Admit Type: Outpatient Age: 77 Room: [facility] ENDO ROOM 4 Gender: Female Note Status: Finalized Procedure:            Colonoscopy of Post-surgical Anatomy Indications:          History of partial colectomy, Diverting Loop ileostomy,                        and partial colectomy in April 2019 - 2 months ago -                        for perforated diverticulitis with abscess. Colonoscopy                        today to rule out any colon lesions prior to reversal. Providers:            Masson Nalepa B. Bonna Gains MD, MD Referring MD:         Caprice Renshaw MD (Referring MD) Medicines:            Monitored Anesthesia Care Complications:        No immediate complications. Procedure:            Pre-Anesthesia Assessment:                       - Prior to the procedure, a History and Physical was                        performed, and patient medications and allergies were                        reviewed. The patient is competent. The risks and                        benefits of the procedure and the sedation options and                        risks were discussed with the patient. All questions                        were answered and informed consent was obtained.                        Patient identification and proposed procedure were                        verified by the physician, the nurse, the                        anesthesiologist, the anesthetist and the technician in                        the pre-procedure area in the procedure room in the                        endoscopy suite. Mental Status Examination: normal.                        Airway  Examination: normal oropharyngeal airway and                        neck mobility. Respiratory Examination: clear to                        auscultation. CV Examination:  normal. Prophylactic                        Antibiotics: The patient does not require prophylactic                        antibiotics. Prior Anticoagulants: The patient has                        taken no previous anticoagulant or antiplatelet agents.                        ASA Grade Assessment: III - A patient with severe                        systemic disease. After reviewing the risks and                        benefits, the patient was deemed in satisfactory                        condition to undergo the procedure. The anesthesia plan                        was to use monitored anesthesia care (MAC). Immediately                        prior to administration of medications, the patient was                        re-assessed for adequacy to receive sedatives. The                        heart rate, respiratory rate, oxygen saturations, blood                        pressure, adequacy of pulmonary ventilation, and                        response to care were monitored throughout the                        procedure. The physical status of the patient was                        re-assessed after the procedure.                       After obtaining informed consent, the endoscope was                        passed under direct vision. Throughout the procedure,                        the patient's blood pressure,  pulse, and oxygen                        saturations were monitored continuously. The                        Colonoscope was introduced through the anus and                        advanced to the the terminal ileum. The ileoscopy was                        performed with ease. The patient tolerated the                        procedure well. The quality of the bowel preparation                        was fair. Findings:      The perianal and digital rectal examinations were normal.      The terminal ileum appeared normal.      There was evidence of a prior end-to-end colo-colonic  anastomosis in the       sigmoid colon. This was patent and was characterized by friable mucosa.       The anastomosis was traversed.      Three sessile, non-bleeding polyps were found in the ascending colon and       cecum. The polyps were 2 to 3 mm in size. These polyps were removed with       a cold biopsy forceps. Resection and retrieval were complete.      A 4 mm polyp was found in the transverse colon. The polyp was sessile.       The polyp was removed with a cold biopsy forceps. Resection and       retrieval were complete.      An area of mildly congested mucosa was found in the entire colon.       Biopsies were taken with a cold forceps for histology. This was       represented by white lesions throughout the colon along with thick white       mucus. 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      Multiple small-mouthed diverticula were found in the sigmoid colon.      Internal hemorrhoids were found during retroflexion.      After the colonoscopy through the rectum, the endoscope was withdrawn       and EGD scope was used to examine the loop ileostomy. Ileostomy was       first examined visually and appered healthy, pink, and normal. Both       ileostomy orifices were first examined via finger insertion and had       normal openings. EGD scope was inserted in both orifices for about 10-20       cm and the small bowel appeared normal. Impression:           - Preparation of the colon was fair.                       - The examined portion of the ileum was normal.                       -  Patent end-to-end colo-colonic anastomosis,                        characterized by friable mucosa.                       - Three 2 to 3 mm, non-bleeding polyps in the ascending                        colon and in the cecum, removed with a cold biopsy                        forceps. Resected and retrieved.                       - One  4 mm polyp in the transverse colon, removed with                        a cold biopsy forceps. Resected and retrieved.                       - Congested mucosa in the entire examined colon.                        Biopsied.                       his was represented by white lesions throughout the                        colon along with thick white mucus. 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                       - Diverticulosis in the sigmoid colon.                       - Internal hemorrhoids.                       - After the colonoscopy through the rectum, the                        endoscope was withdrawn and EGD scope was used to                        examine the loop ileostomy. Ileostomy was first                        examined visually and appered healthy, pink, and                        normal. Both ileostomy orifices were first examined via                        finger insertion and had normal openings. EGD scope was  inserted in both orifices for about 10-20 cm and the                        small bowel appeared normal. Recommendation:       - Await pathology results.                       - Advance diet as tolerated.                       - Return to primary care physician as previously                        scheduled.                       - Return to referring physician as previously scheduled.                       - This was not a screening exam for polyps as the                        presence of mucus throughout the colon impaired                        visualization. Patient will need a screening exam in                        the future after her ileostomy reversal.                       - Return to my office in 3 months.                       - The findings and recommendations were discussed with                        the  patient.                       - The findings and recommendations were discussed with                        the patient's family. Procedure Code(s):    --- Professional ---                       [phone], Colonoscopy, flexible; with biopsy, single or                        multiple Diagnosis Code(s):    --- Professional ---                       K64.8, Other hemorrhoids                       Z98.0, Intestinal bypass and anastomosis status                       D12.2, Benign neoplasm of ascending colon                       D12.0, Benign neoplasm of  cecum                       D12.3, Benign neoplasm of transverse colon (hepatic                        flexure or splenic flexure)                       K63.89, Other specified diseases of intestine                       K57.30, Diverticulosis of large intestine without                        perforation or abscess without bleeding                       Z90.49, Acquired absence of other specified parts of                        digestive tract CPT copyright 2017 [employer]. All rights reserved. The codes documented in this report are preliminary and upon coder review may  be revised to meet current compliance requirements. Attending Participation:      I personally performed the entire procedure.  Vonda Antigua, MD Margretta Sidle B. Bonna Gains MD, MD 09/20/2017 2:31:30 PM This report has been signed electronically. Number of Addenda: 0 Note Initiated On: 09/20/2017 1:22 PM Scope Withdrawal Time: 0 hours 19 minutes 21 seconds  Total Procedure Duration: 0 hours 24 minutes 15 seconds       [facility]

## 2017-09-20 NOTE — Anesthesia Preprocedure Evaluation (Signed)
Anesthesia Evaluation  Patient identified by MRN, date of birth, ID band Patient awake    Reviewed: Allergy & Precautions, NPO status , Patient's Chart, lab work & pertinent test results, reviewed documented beta blocker date and time   Airway Mallampati: II  TM Distance: >3 FB     Dental  (+) Chipped   Pulmonary asthma , former smoker,           Cardiovascular hypertension, Pt. on medications      Neuro/Psych    GI/Hepatic   Endo/Other  diabetes, Type 2  Renal/GU Renal disease     Musculoskeletal  (+) Arthritis ,   Abdominal   Peds  Hematology   Anesthesia Other Findings   Reproductive/Obstetrics                             Anesthesia Physical Anesthesia Plan  ASA: III  Anesthesia Plan: General   Post-op Pain Management:    Induction: Intravenous  PONV Risk Score and Plan:   Airway Management Planned:   Additional Equipment:   Intra-op Plan:   Post-operative Plan:   Informed Consent: I have reviewed the patients History and Physical, chart, labs and discussed the procedure including the risks, benefits and alternatives for the proposed anesthesia with the patient or authorized representative who has indicated his/her understanding and acceptance.     Plan Discussed with: CRNA  Anesthesia Plan Comments:         Anesthesia Quick Evaluation

## 2017-09-20 NOTE — H&P (Signed)
Vonda Antigua, MD [address], [place], [place], [place], [address] [address], [place], [place], [place], [address] Phone: [phone]  Fax: [phone]  Primary Care Physician:  Derinda Late, MD   Pre-Procedure History & Physical: HPI:  Brenda Gregory is a 77 y.o. female is here for a colonoscopy.   Past Medical History:  Diagnosis Date  . Arthritis    knees, right foot  . Asthma    as child  . Diabetes mellitus without complication ([place])   . Diverticulitis of large intestine with perforation and abscess without bleeding 08/07/2017  . Gout   . Hypertension     Past Surgical History:  Procedure Laterality Date  . ABDOMINAL HYSTERECTOMY    . CATARACT EXTRACTION W/PHACO Left 02/25/2016   Procedure: CATARACT EXTRACTION PHACO AND INTRAOCULAR LENS PLACEMENT (IOC);  Surgeon: Eulogio Bear, MD;  Location: [facility];  Service: Ophthalmology;  Laterality: Left;  DIABETIC - oral meds LEFT  . CATARACT EXTRACTION W/PHACO Right 04/07/2016   Procedure: CATARACT EXTRACTION PHACO AND INTRAOCULAR LENS PLACEMENT (IOC);  Surgeon: Eulogio Bear, MD;  Location: [facility];  Service: Ophthalmology;  Laterality: Right;  RIGHT DIABETES - oral meds IVA TOPICAL  . COLECTOMY WITH COLOSTOMY CREATION/HARTMANN PROCEDURE Left 07/18/2017   Procedure: COLECTOMY WITH loop ileostomy;  Surgeon: Vickie Epley, MD;  Location: [facility];  Service: General;  Laterality: Left;  . KNEE SURGERY Left     Prior to Admission medications   Medication Sig Start Date End Date Taking? Authorizing Provider  acetaminophen (TYLENOL) 650 MG CR tablet Take 650 mg by mouth as needed for pain.   Yes [provider]  allopurinol (ZYLOPRIM) 300 MG tablet Take 300 mg by mouth daily.   Yes [provider]  lisinopril (PRINIVIL,ZESTRIL) 20 MG tablet Take 20 mg by mouth daily.   Yes [provider]  metFORMIN (GLUCOPHAGE) 500 MG tablet Take 500 mg by mouth daily  with breakfast.   Yes [provider]  feeding supplement, ENSURE ENLIVE, (ENSURE ENLIVE) LIQD Take 237 mLs by mouth 2 (two) times daily between meals. Patient not taking: Reported on 08/12/2017 07/24/17   Vickie Epley, MD  PEG 3350-KCl-NaBcb-NaCl-NaSulf (PEG 3350/ELECTROLYTES) 240 g SOLR MIX AND DRINK  AS A ONE TIME DOSE 08/19/17   [provider]    Allergies as of 08/19/2017  . (No Known Allergies)    Family History  Problem Relation Age of Onset  . Dementia Mother   . Heart attack Father   . Colon cancer Brother     Social History   Socioeconomic History  . Marital status: Divorced    Spouse name: Not on file  . Number of children: Not on file  . Years of education: Not on file  . Highest education level: Not on file  Occupational History  . Not on file  Social Needs  . Financial resource strain: Not on file  . Food insecurity:    Worry: Not on file    Inability: Not on file  . Transportation needs:    Medical: Not on file    Non-medical: Not on file  Tobacco Use  . Smoking status: Former Smoker    Last attempt to quit: 03/30/1978    Years since quitting: 39.5  . Smokeless tobacco: Never Used  Substance and Sexual Activity  . Alcohol use: No  . Drug use: Never  . Sexual activity: Not Currently  Lifestyle  . Physical activity:    Days  per week: Not on file    Minutes per session: Not on file  . Stress: Not on file  Relationships  . Social connections:    Talks on phone: Not on file    Gets together: Not on file    Attends religious service: Not on file    Active member of club or organization: Not on file    Attends meetings of clubs or organizations: Not on file    Relationship status: Not on file  . Intimate partner violence:    Fear of current or ex partner: Not on file    Emotionally abused: Not on file    Physically abused: Not on file    Forced sexual activity: Not on file  Other Topics Concern  . Not on file  Social History  Narrative  . Not on file    Review of Systems: See HPI, otherwise negative ROS  Physical Exam: BP 100/74   Pulse 100   Temp (!) 96.4 F (35.8 C) (Tympanic)   Resp 18   Ht 5\' 5"  (1.651 m)   Wt 197 lb (89.4 kg)   SpO2 100%   BMI 32.78 kg/m  General:   Alert,  pleasant and cooperative in NAD Head:  Normocephalic and atraumatic. Neck:  Supple; no masses or thyromegaly. Lungs:  Clear throughout to auscultation, normal respiratory effort.    Heart:  +S1, +S2, Regular rate and rhythm, No edema. Abdomen:  Soft, nontender and nondistended. Normal bowel sounds, without guarding, and without rebound.   Neurologic:  Alert and  oriented x4;  grossly normal neurologically.  Impression/Plan: Brenda Gregory is here for a colonoscopy to be performed for  History of perforated diverticulitis, to rule out colonic lesions prior to ileostomy reversal  Risks, benefits, limitations, and alternatives regarding  colonoscopy have been reviewed with the patient.  Questions have been answered.  All parties agreeable.   Virgel Manifold, MD  09/20/2017, 1:08 PM

## 2017-09-22 ENCOUNTER — Ambulatory Visit (INDEPENDENT_AMBULATORY_CARE_PROVIDER_SITE_OTHER): Payer: Medicare HMO | Admitting: Surgery

## 2017-09-22 ENCOUNTER — Encounter: Payer: Self-pay | Admitting: Surgery

## 2017-09-22 VITALS — BP 109/70 | HR 101 | Temp 97.5°F | Ht 65.0 in | Wt 193.6 lb

## 2017-09-22 DIAGNOSIS — Z932 Ileostomy status: Secondary | ICD-10-CM

## 2017-09-22 LAB — SURGICAL PATHOLOGY

## 2017-09-22 NOTE — H&P (View-Only) (Signed)
Surgical Clinic Progress/Follow-up Note   HPI:  77 y.o. Female presents to clinic for follow-up evaluation s/p partial sigmoid colectomy for perforated diverticulitis with large abdominal wall abscess and creation of diverting loop ileostomy, to discuss results of recent colonoscopy, and to schedule reversal of diverting loop ileostomy. Patient reports she feels well with stable moderate yellow pasty ileostomy drainage, and tolerating regular diet with improved appetite and no N/V, fever/chills, CP, or SOB.  Review of Systems:  Constitutional: denies any other weight loss, fever, chills, or sweats  Eyes: denies any other vision changes, history of eye injury  ENT: denies sore throat, hearing problems  Respiratory: denies shortness of breath, wheezing  Cardiovascular: denies chest pain, palpitations  Gastrointestinal: abdominal pain, N/V, and bowel function as per HPI Musculoskeletal: denies any other joint pains or cramps  Skin: Denies any other rashes or skin discolorations  Neurological: denies any other headache, dizziness, weakness  Psychiatric: denies any other depression, anxiety  All other review of systems: otherwise negative   Vital Signs:  BP 109/70   Pulse (!) 101   Temp (!) 97.5 F (36.4 C) (Oral)   Ht 5\' 5"  (1.651 m)   Wt 193 lb 9.6 oz (87.8 kg)   BMI 32.22 kg/m    Physical Exam:  Constitutional:  -- Overweight body habitus  -- Awake, alert, and oriented x3  Eyes:  -- Pupils equally round and reactive to light  -- No scleral icterus  Ear, nose, throat:  -- No jugular venous distension  -- No nasal drainage, bleeding Pulmonary:  -- No crackles -- Equal breath sounds bilaterally -- Breathing non-labored at rest Cardiovascular:  -- S1, S2 present  -- No pericardial rubs  Gastrointestinal:  -- Soft, nontender, non-distended, no guarding/rebound -- Pink, viable and functional Right-sided ileostomy with yellow pasty enteric material in ostomy bag without  leakage -- No abdominal masses appreciated, pulsatile or otherwise  Musculoskeletal / Integumentary:  -- Wounds or skin discoloration: No new open wounds appreciated  -- Extremities: B/L UE and LE FROM, hands and feet warm, no edema  Neurologic:  -- Motor function: intact and symmetric  -- Sensation: intact and symmetric   Laboratory studies:  CBC Latest Ref Rng & Units 08/06/2017 07/22/2017 07/22/2017  WBC 3.6 - 11.0 K/uL 5.9 8.9 8.2  Hemoglobin 12.0 - 16.0 g/dL 11.1(L) 10.2(L) 10.4(L)  Hematocrit 35.0 - 47.0 % 33.9(L) 30.2(L) 29.7(L)  Platelets 150 - 440 K/uL 288 333 355   CMP Latest Ref Rng & Units 08/06/2017 07/23/2017 07/22/2017  Glucose 65 - 99 mg/dL 124(H) 127(H) 200(H)  BUN 6 - 20 mg/dL 22(H) 28(H) 31(H)  Creatinine 0.44 - 1.00 mg/dL 1.55(H) 1.14(H) 1.09(H)  Sodium 135 - 145 mmol/L 137 133(L) 131(L)  Potassium 3.5 - 5.1 mmol/L 4.4 4.3 4.2  Chloride 101 - 111 mmol/L 104 100(L) 101  CO2 22 - 32 mmol/L 24 26 24   Calcium 8.9 - 10.3 mg/dL 9.1 7.9(L) 7.7(L)  Total Protein 6.5 - 8.1 g/dL - - 5.7(L)  Total Bilirubin 0.3 - 1.2 mg/dL - - 0.8  Alkaline Phos 38 - 126 U/L - - 107  AST 15 - 41 U/L - - 58(H)  ALT 14 - 54 U/L - - 44    Imaging:  Colonoscopy Bonna Gains, 09/20/2017) - Preparation of the colon was fair. - The examined portion of the ileum was normal. - Patent end-to-end colo-colonic anastomosis, characterized by friable mucosa. - Three 2 to 3 mm, non-bleeding polyps in the ascending colon and in the cecum,  removed with a cold biopsy forceps. Resected and retrieved. - One 4 mm polyp in the transverse colon, removed with a cold biopsy forceps. Resected and retrieved. - Congested mucosa in the entire examined colon. Biopsied. his was represented by white lesions throughout the colon along with thick white mucus. 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 - Diverticulosis in the sigmoid colon. - Internal  hemorrhoids. - After the colonoscopy through the rectum, the endoscope was withdrawn and EGD scope was used to examine the loop ileostomy. Ileostomy was first examined visually and appered healthy, pink, and normal. Both ileostomy orifices were first examined via finger insertion and had normal openings. EGD scope was inserted in both orifices for about 10-20 cm and the small bowel appeared normal.  Assessment:  77 y.o. yo Female with a problem list including...  Patient Active Problem List   Diagnosis Date Noted  . History of colon resection   . Benign neoplasm of ascending colon   . Benign neoplasm of cecum   . Benign neoplasm of transverse colon   . Decreased vascular markings of colon   . Diverticulosis of large intestine without diverticulitis   . Intestinal bypass or anastomosis status   . Diabetes ([place]) 07/15/2017  . HTN (hypertension) 07/15/2017  . Gout 07/15/2017  . Acute renal failure superimposed on chronic kidney disease ([place]) 07/15/2017  . CKD (chronic kidney disease) stage 3, GFR 30-59 ml/min ([facility]) 01/21/2015  . Asthma, well controlled 01/21/2015  . Acute kidney injury (nontraumatic) ([place]) 01/13/2015    presents to clinic for follow-up evaluation s/p partial sigmoid colectomy for perforated diverticulitis with large abdominal wall abscess and creation of diverting loop ileostomy, to discuss results of recent colonoscopy, and to schedule reversal of diverting loop ileostomy.  Plan:   - results of recent colonoscopy discussed with patient and her daughter   - continue current management with emphasis on importance of nutrition  - all risks, benefits, and alternatives to reversal of diverting loop ileostomy were discussed with the patient and her daughter, all of their questions were answered to their expressed satisfaction, patient expresses she wishes to proceed, and informed consent was accordingly obtained.  - will plan for elective reversal of diverting loop ileostomy  on Wednesday, 7/17 per patient request and OR availability  - anticipate return to clinic 2 weeks after above planned procedure  - instructed to call office if any questions or concerns  All of the above recommendations were discussed with the patient and patient's daughter, and all of patient's and family's questions were answered to their expressed satisfaction.  -- Marilynne Drivers [name], MD, [place]: [facility] - Partnering for exceptional care. Office: [phone]

## 2017-09-22 NOTE — Patient Instructions (Addendum)
We have spoken today about reversing your Ostomy. You are requesting to have this done.  We will arrange this to be done on 10/13/17  by Dr. Tama High  at [facility].   Plan on being in the hospital between 5-7 days after surgery. You will be started on a liquid diet and then advanced as tolerated prior to going home.  If you have any disability or FMLA paperwork that needs to be filled out for your employer, please bring this in prior to surgery and it will be filled out upon your discharge from the hospital. We can give you a note anticipating your surgery date. If your employer is in need of this, please let us know.  To prep for your surgery, You will need to complete a bowel prep, 2 antibiotics, and a fleets enema prior to surgery. Your antibiotics are Neomycin and Erythromycin and these will be taken at 8am, 2pm, 8pm on the day of your prep. (Please see the Bowel sheet provided)  Please see your [employer]) Pre-Care Sheet for more information. If you have any questions, please call our office and ask for a nurse.  End Colostomy Reversal An end colostomy reversal is surgery that reverses an end colostomy. The large intestine is disconnected from the opening in the abdomen (stoma). Then it is reconnected to the large intestine inside the body. A stoma and pouch are no longer needed. Bowel movements can resume through the rectum. LET [facility] CARE PROVIDER KNOW ABOUT:  Allergies to food or medicine.  Medicines taken, including vitamins, health supplements, herbs, eye drops, over-the-counter medicines, and creams.  Use of steroids (by mouth or creams).  Previous problems with anesthetics or numbing medicines.  History of bleeding problems or blood clots.  Previous surgery.  Other health problems, including diabetes and kidney problems.  Possibility of pregnancy, if this applies. RISKS AND COMPLICATIONS General surgical complications may include the following:  Reaction to  anesthetics.  Damage to surrounding nerves, tissues, or structures.  Blood clot.  Bleeding.  Scarring. Specific risks for colostomy reversal, while rare, may include:  Intestinal paralysis (ileus). This is a normal part of recovery. It usually goes away in 3-7 days. However, it can last longer in some people.  Leaking at the joined part of the intestine (anastomotic leak).  Infection of the surgical cut (incision) or the place where the stoma was located.  A collection of pus (abscess) in the abdomen or pelvis.  Intestinal blockage.  Narrowing at the joined part of the intestine (stricture).  Urinary and sexual dysfunction. BEFORE THE PROCEDURE It is important to follow your health care provider's instructions prior to your procedure. This will help you to avoid complications. Steps before your procedure may include:  A physical exam, rectal exam, X-rays, colonoscopy, and other procedures.  Chemotherapy or radiation therapy, if the stoma was created due to cancer.  A review of the procedure, the anesthetic being used, and what to expect after the procedure. You may be asked to:  Stop taking certain medicines for several days prior to your procedure. These may include blood thinners (such as aspirin).  Take certain medicines, such as antibiotics or stool softeners.  Avoid eating and drinking after midnight the night before the procedure. This will help you to avoid complications from the anesthetic.  Quit smoking. Smoking increases the chances of a healing problem after your procedure. PROCEDURE You will be given medicine that makes you sleep (general anesthetic). The procedure may be done as open  surgery, with a large incision. It may also be done as laparoscopic surgery, with several smaller incisions. The surgeon will stitch or staple the intestine ends back together. This surgery takes several hours. AFTER THE PROCEDURE  You will be given pain medicine.  Slowly  increase your diet and movement as directed by your health care provider.  You should arrange for someone to help you with activities at home while you recover.   This information is not intended to replace advice given to you by your health care provider. Make sure you discuss any questions you have with your health care provider.   Document Released: 06/08/2011 Document Revised: 07/31/2014 Document Reviewed: 06/08/2011 Elsevier Interactive Patient Education [employer].

## 2017-09-22 NOTE — Progress Notes (Signed)
Surgical Clinic Progress/Follow-up Note   HPI:  78 y.o. Female presents to clinic for follow-up evaluation s/p partial sigmoid colectomy for perforated diverticulitis with large abdominal wall abscess and creation of diverting loop ileostomy, to discuss results of recent colonoscopy, and to schedule reversal of diverting loop ileostomy. Patient reports she feels well with stable moderate yellow pasty ileostomy drainage, and tolerating regular diet with improved appetite and no N/V, fever/chills, CP, or SOB.  Review of Systems:  Constitutional: denies any other weight loss, fever, chills, or sweats  Eyes: denies any other vision changes, history of eye injury  ENT: denies sore throat, hearing problems  Respiratory: denies shortness of breath, wheezing  Cardiovascular: denies chest pain, palpitations  Gastrointestinal: abdominal pain, N/V, and bowel function as per HPI Musculoskeletal: denies any other joint pains or cramps  Skin: Denies any other rashes or skin discolorations  Neurological: denies any other headache, dizziness, weakness  Psychiatric: denies any other depression, anxiety  All other review of systems: otherwise negative   Vital Signs:  BP 109/70   Pulse (!) 101   Temp (!) 97.5 F (36.4 C) (Oral)   Ht 5\' 5"  (1.651 m)   Wt 193 lb 9.6 oz (87.8 kg)   BMI 32.22 kg/m    Physical Exam:  Constitutional:  -- Overweight body habitus  -- Awake, alert, and oriented x3  Eyes:  -- Pupils equally round and reactive to light  -- No scleral icterus  Ear, nose, throat:  -- No jugular venous distension  -- No nasal drainage, bleeding Pulmonary:  -- No crackles -- Equal breath sounds bilaterally -- Breathing non-labored at rest Cardiovascular:  -- S1, S2 present  -- No pericardial rubs  Gastrointestinal:  -- Soft, nontender, non-distended, no guarding/rebound -- Pink, viable and functional Right-sided ileostomy with yellow pasty enteric material in ostomy bag without  leakage -- No abdominal masses appreciated, pulsatile or otherwise  Musculoskeletal / Integumentary:  -- Wounds or skin discoloration: No new open wounds appreciated  -- Extremities: B/L UE and LE FROM, hands and feet warm, no edema  Neurologic:  -- Motor function: intact and symmetric  -- Sensation: intact and symmetric   Laboratory studies:  CBC Latest Ref Rng & Units 08/06/2017 07/22/2017 07/22/2017  WBC 3.6 - 11.0 K/uL 5.9 8.9 8.2  Hemoglobin 12.0 - 16.0 g/dL 11.1(L) 10.2(L) 10.4(L)  Hematocrit 35.0 - 47.0 % 33.9(L) 30.2(L) 29.7(L)  Platelets 150 - 440 K/uL 288 333 355   CMP Latest Ref Rng & Units 08/06/2017 07/23/2017 07/22/2017  Glucose 65 - 99 mg/dL 124(H) 127(H) 200(H)  BUN 6 - 20 mg/dL 22(H) 28(H) 31(H)  Creatinine 0.44 - 1.00 mg/dL 1.55(H) 1.14(H) 1.09(H)  Sodium 135 - 145 mmol/L 137 133(L) 131(L)  Potassium 3.5 - 5.1 mmol/L 4.4 4.3 4.2  Chloride 101 - 111 mmol/L 104 100(L) 101  CO2 22 - 32 mmol/L 24 26 24   Calcium 8.9 - 10.3 mg/dL 9.1 7.9(L) 7.7(L)  Total Protein 6.5 - 8.1 g/dL - - 5.7(L)  Total Bilirubin 0.3 - 1.2 mg/dL - - 0.8  Alkaline Phos 38 - 126 U/L - - 107  AST 15 - 41 U/L - - 58(H)  ALT 14 - 54 U/L - - 44    Imaging:  Colonoscopy Bonna Gains, 09/20/2017) - Preparation of the colon was fair. - The examined portion of the ileum was normal. - Patent end-to-end colo-colonic anastomosis, characterized by friable mucosa. - Three 2 to 3 mm, non-bleeding polyps in the ascending colon and in the cecum,  removed with a cold biopsy forceps. Resected and retrieved. - One 4 mm polyp in the transverse colon, removed with a cold biopsy forceps. Resected and retrieved. - Congested mucosa in the entire examined colon. Biopsied. his was represented by white lesions throughout the colon along with thick white mucus. 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 - Diverticulosis in the sigmoid colon. - Internal  hemorrhoids. - After the colonoscopy through the rectum, the endoscope was withdrawn and EGD scope was used to examine the loop ileostomy. Ileostomy was first examined visually and appered healthy, pink, and normal. Both ileostomy orifices were first examined via finger insertion and had normal openings. EGD scope was inserted in both orifices for about 10-20 cm and the small bowel appeared normal.  Assessment:  77 y.o. yo Female with a problem list including...  Patient Active Problem List   Diagnosis Date Noted  . History of colon resection   . Benign neoplasm of ascending colon   . Benign neoplasm of cecum   . Benign neoplasm of transverse colon   . Decreased vascular markings of colon   . Diverticulosis of large intestine without diverticulitis   . Intestinal bypass or anastomosis status   . Diabetes ([place]) 07/15/2017  . HTN (hypertension) 07/15/2017  . Gout 07/15/2017  . Acute renal failure superimposed on chronic kidney disease ([place]) 07/15/2017  . CKD (chronic kidney disease) stage 3, GFR 30-59 ml/min ([facility]) 01/21/2015  . Asthma, well controlled 01/21/2015  . Acute kidney injury (nontraumatic) ([place]) 01/13/2015    presents to clinic for follow-up evaluation s/p partial sigmoid colectomy for perforated diverticulitis with large abdominal wall abscess and creation of diverting loop ileostomy, to discuss results of recent colonoscopy, and to schedule reversal of diverting loop ileostomy.  Plan:   - results of recent colonoscopy discussed with patient and her daughter   - continue current management with emphasis on importance of nutrition  - all risks, benefits, and alternatives to reversal of diverting loop ileostomy were discussed with the patient and her daughter, all of their questions were answered to their expressed satisfaction, patient expresses she wishes to proceed, and informed consent was accordingly obtained.  - will plan for elective reversal of diverting loop ileostomy  on Wednesday, 7/17 per patient request and OR availability  - anticipate return to clinic 2 weeks after above planned procedure  - instructed to call office if any questions or concerns  All of the above recommendations were discussed with the patient and patient's daughter, and all of patient's and family's questions were answered to their expressed satisfaction.  -- Marilynne Drivers [name], MD, [place]: [facility] - Partnering for exceptional care. Office: [phone]

## 2017-09-24 ENCOUNTER — Encounter: Payer: Self-pay | Admitting: Surgery

## 2017-09-27 ENCOUNTER — Telehealth: Payer: Self-pay | Admitting: Surgery

## 2017-09-27 NOTE — Telephone Encounter (Signed)
Pt advised of pre op date/time and sx date. Sx: 10/13/17 with Dr Carolee Rota ileostomy reversal Pre op: 10/08/17 @ 11:15am--office interview  Patient made aware to call [phone], between 1-3:00pm the day before surgery, to find out what time to arrive.

## 2017-10-01 DIAGNOSIS — Z7984 Long term (current) use of oral hypoglycemic drugs: Secondary | ICD-10-CM | POA: Diagnosis not present

## 2017-10-01 DIAGNOSIS — I10 Essential (primary) hypertension: Secondary | ICD-10-CM | POA: Diagnosis not present

## 2017-10-01 DIAGNOSIS — Z96652 Presence of left artificial knee joint: Secondary | ICD-10-CM | POA: Diagnosis not present

## 2017-10-01 DIAGNOSIS — M1711 Unilateral primary osteoarthritis, right knee: Secondary | ICD-10-CM | POA: Diagnosis not present

## 2017-10-01 DIAGNOSIS — E119 Type 2 diabetes mellitus without complications: Secondary | ICD-10-CM | POA: Diagnosis not present

## 2017-10-01 DIAGNOSIS — J45909 Unspecified asthma, uncomplicated: Secondary | ICD-10-CM | POA: Diagnosis not present

## 2017-10-01 DIAGNOSIS — Z48815 Encounter for surgical aftercare following surgery on the digestive system: Secondary | ICD-10-CM | POA: Diagnosis not present

## 2017-10-01 DIAGNOSIS — Z432 Encounter for attention to ileostomy: Secondary | ICD-10-CM | POA: Diagnosis not present

## 2017-10-01 DIAGNOSIS — M109 Gout, unspecified: Secondary | ICD-10-CM | POA: Diagnosis not present

## 2017-10-08 ENCOUNTER — Encounter
Admission: RE | Admit: 2017-10-08 | Discharge: 2017-10-08 | Disposition: A | Discharge status: Home / Self Care | Payer: Medicare HMO | Source: Ambulatory Visit | Attending: Surgery | Admitting: Surgery

## 2017-10-08 ENCOUNTER — Other Ambulatory Visit: Payer: Self-pay

## 2017-10-08 DIAGNOSIS — Z432 Encounter for attention to ileostomy: Secondary | ICD-10-CM | POA: Diagnosis not present

## 2017-10-08 DIAGNOSIS — I10 Essential (primary) hypertension: Secondary | ICD-10-CM | POA: Diagnosis not present

## 2017-10-08 DIAGNOSIS — E875 Hyperkalemia: Secondary | ICD-10-CM | POA: Diagnosis not present

## 2017-10-08 HISTORY — DX: Family history of other specified conditions: Z84.89

## 2017-10-08 LAB — COMPREHENSIVE METABOLIC PANEL
ALBUMIN: 3.9 g/dL (ref 3.5–5.0)
ALT: 13 U/L (ref 0–44)
ANION GAP: 7 (ref 5–15)
AST: 18 U/L (ref 15–41)
Alkaline Phosphatase: 72 U/L (ref 38–126)
BILIRUBIN TOTAL: 0.8 mg/dL (ref 0.3–1.2)
BUN: 57 mg/dL — ABNORMAL HIGH (ref 8–23)
CO2: 20 mmol/L — ABNORMAL LOW (ref 22–32)
Calcium: 9.5 mg/dL (ref 8.9–10.3)
Chloride: 112 mmol/L — ABNORMAL HIGH (ref 98–111)
Creatinine, Ser: 1.77 mg/dL — ABNORMAL HIGH (ref 0.44–1.00)
GFR calc Af Amer: 31 mL/min — ABNORMAL LOW (ref >60)
GFR calc non Af Amer: 27 mL/min — ABNORMAL LOW (ref >60)
GLUCOSE: 117 mg/dL — AB (ref 70–99)
POTASSIUM: 6 mmol/L — AB (ref 3.5–5.1)
Sodium: 139 mmol/L (ref 135–145)
Total Protein: 8 g/dL (ref 6.5–8.1)

## 2017-10-08 LAB — CBC WITH DIFFERENTIAL/PLATELET
BASOS ABS: 0 10*3/uL (ref 0–0.1)
Basophils Relative: 1 %
Eosinophils Absolute: 0.2 10*3/uL (ref 0–0.7)
Eosinophils Relative: 3 %
HCT: 37.5 % (ref 35.0–47.0)
HEMOGLOBIN: 12.2 g/dL (ref 12.0–16.0)
LYMPHS ABS: 1.5 10*3/uL (ref 1.0–3.6)
LYMPHS PCT: 24 %
MCH: 29.8 pg (ref 26.0–34.0)
MCHC: 32.6 g/dL (ref 32.0–36.0)
MCV: 91.4 fL (ref 80.0–100.0)
Monocytes Absolute: 0.5 10*3/uL (ref 0.2–0.9)
Monocytes Relative: 8 %
NEUTROS PCT: 64 %
Neutro Abs: 4.1 10*3/uL (ref 1.4–6.5)
PLATELETS: 216 10*3/uL (ref 150–440)
RBC: 4.11 MIL/uL (ref 3.80–5.20)
RDW: 17.8 % — AB (ref 11.5–14.5)
WBC: 6.4 10*3/uL (ref 3.6–11.0)

## 2017-10-08 NOTE — Patient Instructions (Signed)
Your procedure is scheduled on: Wednesday 10/13/17 Report to [facility]. To find out your arrival time please call [phone] between 1PM - 3PM on Tuesday 10/12/17.  Remember: Instructions that are not followed completely may result in serious medical risk, up to and including death, or upon the discretion of your surgeon and anesthesiologist your surgery may need to be rescheduled.     _X__ 1. Do not eat food after midnight the night before your procedure.                 No gum chewing or hard candies. You may drink clear liquids up to 2 hours                 before you are scheduled to arrive for your surgery- DO not drink clear                 liquids within 2 hours of the start of your surgery.                 Clear Liquids include:  water, apple juice without pulp, clear carbohydrate                 drink such as Clearfast or Gatorade, Black Coffee or Tea (Do not add                 anything to coffee or tea).  __X__2.  On the morning of surgery brush your teeth with toothpaste and water, you                 may rinse your mouth with mouthwash if you wish.  Do not swallow any              toothpaste of mouthwash.     _X__ 3.  No Alcohol for 24 hours before or after surgery.   _X__ 4.  Do Not Smoke or use e-cigarettes For 24 Hours Prior to Your Surgery.                 Do not use any chewable tobacco products for at least 6 hours prior to                 surgery.  ____  5.  Bring all medications with you on the day of surgery if instructed.   __X__  6.  Notify your doctor if there is any change in your medical condition      (cold, fever, infections).     Do not wear jewelry, make-up, hairpins, clips or nail polish. Do not wear lotions, powders, or perfumes.  Do not shave 48 hours prior to surgery. Men may shave face and neck. Do not bring valuables to the hospital.    [facility] is not responsible for any belongings or  valuables.  Contacts, dentures/partials or body piercings may not be worn into surgery. Bring a case for your contacts, glasses or hearing aids, a denture cup will be supplied. Leave your suitcase in the car. After surgery it may be brought to your room. For patients admitted to the hospital, discharge time is determined by your treatment team.   Patients discharged the day of surgery will not be allowed to drive home.   Please read over the following fact sheets that you were given:   MRSA Information  __X__ Take these medicines the morning of surgery with A SIP OF WATER:  1. none  2.   3.   4.  5.  6.  ____ Fleet Enema (as directed)   __X__ Use CHG Soap/SAGE wipes as directed  ____ Use inhalers on the day of surgery  __X__ Stop metformin/Janumet/Farxiga 2 days prior to surgery    ____ Take 1/2 of usual insulin dose the night before surgery. No insulin the morning          of surgery.   ____ Stop Blood Thinners Coumadin/Plavix/Xarelto/Pleta/Pradaxa/Eliquis/Effient/Aspirin  on   Or contact your Surgeon, Cardiologist or Medical Doctor regarding  ability to stop your blood thinners  __X__ Stop Anti-inflammatories 7 days before surgery such as Advil, Ibuprofen, Motrin,  BC or Goodies Powder, Naprosyn, Naproxen, Aleve, Aspirin  OK TO TAKE TYLENOL   __X__ Stop all herbal supplements, fish oil or vitamin E until after surgery.    ____ Bring C-Pap to the hospital.

## 2017-10-09 NOTE — Pre-Procedure Instructions (Signed)
Met B results sent to Dr. [name] and Anesthesia for review.

## 2017-10-11 ENCOUNTER — Other Ambulatory Visit
Admission: RE | Admit: 2017-10-11 | Discharge: 2017-10-11 | Disposition: A | Discharge status: Home / Self Care | Payer: Medicare HMO | Source: Ambulatory Visit | Attending: Surgery | Admitting: Surgery

## 2017-10-11 ENCOUNTER — Telehealth: Payer: Self-pay | Admitting: Surgery

## 2017-10-11 ENCOUNTER — Encounter: Payer: Self-pay | Admitting: Emergency Medicine

## 2017-10-11 ENCOUNTER — Inpatient Hospital Stay
Admission: EM | Admit: 2017-10-11 | Discharge: 2017-10-16 | DRG: 330 | Disposition: A | Discharge status: Home / Self Care | Payer: Medicare HMO | Attending: Surgery | Admitting: Surgery

## 2017-10-11 DIAGNOSIS — K5720 Diverticulitis of large intestine with perforation and abscess without bleeding: Secondary | ICD-10-CM

## 2017-10-11 DIAGNOSIS — E1122 Type 2 diabetes mellitus with diabetic chronic kidney disease: Secondary | ICD-10-CM | POA: Diagnosis not present

## 2017-10-11 DIAGNOSIS — Z432 Encounter for attention to ileostomy: Secondary | ICD-10-CM | POA: Diagnosis not present

## 2017-10-11 DIAGNOSIS — I129 Hypertensive chronic kidney disease with stage 1 through stage 4 chronic kidney disease, or unspecified chronic kidney disease: Secondary | ICD-10-CM | POA: Diagnosis present

## 2017-10-11 DIAGNOSIS — Z932 Ileostomy status: Secondary | ICD-10-CM

## 2017-10-11 DIAGNOSIS — Z90710 Acquired absence of both cervix and uterus: Secondary | ICD-10-CM

## 2017-10-11 DIAGNOSIS — E669 Obesity, unspecified: Secondary | ICD-10-CM | POA: Diagnosis present

## 2017-10-11 DIAGNOSIS — E860 Dehydration: Secondary | ICD-10-CM | POA: Diagnosis present

## 2017-10-11 DIAGNOSIS — N183 Chronic kidney disease, stage 3 (moderate): Secondary | ICD-10-CM | POA: Diagnosis present

## 2017-10-11 DIAGNOSIS — M109 Gout, unspecified: Secondary | ICD-10-CM | POA: Diagnosis present

## 2017-10-11 DIAGNOSIS — Z87891 Personal history of nicotine dependence: Secondary | ICD-10-CM

## 2017-10-11 DIAGNOSIS — Z7984 Long term (current) use of oral hypoglycemic drugs: Secondary | ICD-10-CM

## 2017-10-11 DIAGNOSIS — Z6833 Body mass index (BMI) 33.0-33.9, adult: Secondary | ICD-10-CM

## 2017-10-11 DIAGNOSIS — N179 Acute kidney failure, unspecified: Secondary | ICD-10-CM | POA: Diagnosis present

## 2017-10-11 DIAGNOSIS — K5792 Diverticulitis of intestine, part unspecified, without perforation or abscess without bleeding: Secondary | ICD-10-CM | POA: Diagnosis not present

## 2017-10-11 DIAGNOSIS — N289 Disorder of kidney and ureter, unspecified: Secondary | ICD-10-CM

## 2017-10-11 DIAGNOSIS — Z9889 Other specified postprocedural states: Secondary | ICD-10-CM | POA: Diagnosis not present

## 2017-10-11 DIAGNOSIS — E875 Hyperkalemia: Secondary | ICD-10-CM | POA: Diagnosis present

## 2017-10-11 DIAGNOSIS — E119 Type 2 diabetes mellitus without complications: Secondary | ICD-10-CM | POA: Diagnosis not present

## 2017-10-11 DIAGNOSIS — J45909 Unspecified asthma, uncomplicated: Secondary | ICD-10-CM | POA: Diagnosis not present

## 2017-10-11 LAB — GLUCOSE, CAPILLARY
GLUCOSE-CAPILLARY: 79 mg/dL (ref 70–99)
GLUCOSE-CAPILLARY: 114 mg/dL — AB (ref 70–99)

## 2017-10-11 LAB — COMPREHENSIVE METABOLIC PANEL
ALK PHOS: 73 U/L (ref 38–126)
ALT: 14 U/L (ref 0–44)
AST: 21 U/L (ref 15–41)
Albumin: 3.9 g/dL (ref 3.5–5.0)
Anion gap: 9 (ref 5–15)
BUN: 54 mg/dL — AB (ref 8–23)
CO2: 16 mmol/L — ABNORMAL LOW (ref 22–32)
CREATININE: 2.15 mg/dL — AB (ref 0.44–1.00)
Calcium: 9.1 mg/dL (ref 8.9–10.3)
Chloride: 113 mmol/L — ABNORMAL HIGH (ref 98–111)
GFR, EST AFRICAN AMERICAN: 25 mL/min — AB (ref >60)
GFR, EST NON AFRICAN AMERICAN: 21 mL/min — AB (ref >60)
Glucose, Bld: 162 mg/dL — ABNORMAL HIGH (ref 70–99)
Potassium: 5.7 mmol/L — ABNORMAL HIGH (ref 3.5–5.1)
Sodium: 138 mmol/L (ref 135–145)
TOTAL PROTEIN: 7.5 g/dL (ref 6.5–8.1)
Total Bilirubin: 0.8 mg/dL (ref 0.3–1.2)

## 2017-10-11 LAB — CBC
HCT: 35.6 % (ref 35.0–47.0)
Hemoglobin: 12.3 g/dL (ref 12.0–16.0)
MCH: 31.4 pg (ref 26.0–34.0)
MCHC: 34.6 g/dL (ref 32.0–36.0)
MCV: 90.6 fL (ref 80.0–100.0)
PLATELETS: 215 10*3/uL (ref 150–440)
RBC: 3.92 MIL/uL (ref 3.80–5.20)
RDW: 17.7 % — AB (ref 11.5–14.5)
WBC: 6.6 10*3/uL (ref 3.6–11.0)

## 2017-10-11 LAB — BASIC METABOLIC PANEL
ANION GAP: 8 (ref 5–15)
BUN: 52 mg/dL — ABNORMAL HIGH (ref 8–23)
CALCIUM: 8.5 mg/dL — AB (ref 8.9–10.3)
CO2: 18 mmol/L — ABNORMAL LOW (ref 22–32)
Chloride: 115 mmol/L — ABNORMAL HIGH (ref 98–111)
Creatinine, Ser: 2.03 mg/dL — ABNORMAL HIGH (ref 0.44–1.00)
GFR calc non Af Amer: 23 mL/min — ABNORMAL LOW (ref >60)
GFR, EST AFRICAN AMERICAN: 26 mL/min — AB (ref >60)
GLUCOSE: 114 mg/dL — AB (ref 70–99)
Potassium: 6.3 mmol/L (ref 3.5–5.1)
SODIUM: 141 mmol/L (ref 135–145)

## 2017-10-11 MED ORDER — ALBUTEROL SULFATE (2.5 MG/3ML) 0.083% IN NEBU: 2.5000 mg | INHALATION_SOLUTION | RESPIRATORY_TRACT | Status: DC | PRN

## 2017-10-11 MED ORDER — DEXTROSE 50 % IV SOLN
25.0000 g | Freq: Once | INTRAVENOUS | Status: AC
  Administered 2017-10-11: 25 g via INTRAVENOUS
  Filled 2017-10-11: qty 50

## 2017-10-11 MED ORDER — PATIROMER SORBITEX CALCIUM 8.4 G PO PACK
8.4000 g | PACK | Freq: Every day | ORAL | Status: AC
  Administered 2017-10-11 – 2017-10-12 (×2): 8.4 g via ORAL
  Filled 2017-10-11 (×2): qty 1

## 2017-10-11 MED ORDER — ACETAMINOPHEN ER 650 MG PO TBCR: 650.0000 mg | EXTENDED_RELEASE_TABLET | Freq: Three times a day (TID) | ORAL | Status: DC | PRN

## 2017-10-11 MED ORDER — ALLOPURINOL 300 MG PO TABS
300.0000 mg | ORAL_TABLET | Freq: Every day | ORAL | Status: DC
  Administered 2017-10-14 – 2017-10-16 (×3): 300 mg via ORAL
  Filled 2017-10-11 (×5): qty 1

## 2017-10-11 MED ORDER — INSULIN ASPART 100 UNIT/ML ~~LOC~~ SOLN
0.0000 [IU] | Freq: Three times a day (TID) | SUBCUTANEOUS | Status: DC
  Administered 2017-10-13: 3 [IU] via SUBCUTANEOUS
  Administered 2017-10-13 – 2017-10-15 (×3): 2 [IU] via SUBCUTANEOUS
  Filled 2017-10-11 (×4): qty 1

## 2017-10-11 MED ORDER — DOCUSATE SODIUM 100 MG PO CAPS
100.0000 mg | ORAL_CAPSULE | Freq: Two times a day (BID) | ORAL | Status: DC
  Administered 2017-10-11 – 2017-10-15 (×4): 100 mg via ORAL
  Filled 2017-10-11 (×8): qty 1

## 2017-10-11 MED ORDER — INSULIN ASPART 100 UNIT/ML ~~LOC~~ SOLN: 0.0000 [IU] | Freq: Every day | SUBCUTANEOUS | Status: DC

## 2017-10-11 MED ORDER — HEPARIN SODIUM (PORCINE) 5000 UNIT/ML IJ SOLN
5000.0000 [IU] | Freq: Three times a day (TID) | INTRAMUSCULAR | Status: DC
  Administered 2017-10-11 – 2017-10-13 (×5): 5000 [IU] via SUBCUTANEOUS
  Filled 2017-10-11 (×4): qty 1

## 2017-10-11 MED ORDER — TRAZODONE HCL 50 MG PO TABS
50.0000 mg | ORAL_TABLET | Freq: Every evening | ORAL | Status: DC | PRN
  Filled 2017-10-11: qty 1

## 2017-10-11 MED ORDER — BISACODYL 10 MG RE SUPP: 10.0000 mg | Freq: Every day | RECTAL | Status: DC | PRN

## 2017-10-11 MED ORDER — SODIUM CHLORIDE 0.9 % IV BOLUS
1000.0000 mL | Freq: Once | INTRAVENOUS | Status: AC
  Administered 2017-10-11: 1000 mL via INTRAVENOUS

## 2017-10-11 MED ORDER — SODIUM CHLORIDE 0.9% FLUSH
3.0000 mL | Freq: Two times a day (BID) | INTRAVENOUS | Status: DC
  Administered 2017-10-11 – 2017-10-16 (×4): 3 mL via INTRAVENOUS

## 2017-10-11 MED ORDER — SODIUM CHLORIDE 0.9 % IV SOLN
Freq: Once | INTRAVENOUS | Status: AC
  Administered 2017-10-11: 12:00:00 via INTRAVENOUS

## 2017-10-11 MED ORDER — ONDANSETRON HCL 4 MG/2ML IJ SOLN: 4.0000 mg | Freq: Four times a day (QID) | INTRAMUSCULAR | Status: DC | PRN

## 2017-10-11 MED ORDER — PATIROMER SORBITEX CALCIUM 8.4 G PO PACK
16.8000 g | PACK | Freq: Every day | ORAL | Status: DC
  Administered 2017-10-11: 16.8 g via ORAL
  Filled 2017-10-11: qty 2

## 2017-10-11 MED ORDER — ONDANSETRON HCL 4 MG PO TABS: 4.0000 mg | ORAL_TABLET | Freq: Four times a day (QID) | ORAL | Status: DC | PRN

## 2017-10-11 MED ORDER — INSULIN ASPART 100 UNIT/ML ~~LOC~~ SOLN
8.0000 [IU] | Freq: Once | SUBCUTANEOUS | Status: AC
  Administered 2017-10-11: 8 [IU] via INTRAVENOUS
  Filled 2017-10-11: qty 1

## 2017-10-11 MED ORDER — SODIUM CHLORIDE 0.45 % IV SOLN
INTRAVENOUS | Status: DC
  Administered 2017-10-11 – 2017-10-15 (×8): via INTRAVENOUS

## 2017-10-11 MED ORDER — ACETAMINOPHEN 325 MG PO TABS: 650.0000 mg | ORAL_TABLET | Freq: Four times a day (QID) | ORAL | Status: DC | PRN

## 2017-10-11 MED ORDER — POLYETHYLENE GLYCOL 3350 17 G PO PACK: 17.0000 g | PACK | Freq: Every day | ORAL | Status: DC | PRN

## 2017-10-11 NOTE — Telephone Encounter (Signed)
Patient called and has some questions for her upcoming surgery, has questions about the prep that she has to drink. Please call patient and advise.

## 2017-10-11 NOTE — Progress Notes (Signed)
Family Meeting Note  Advance Directive:yes  Today a meeting took place with the Patient.  Patient is able to participate   The following clinical team members were present during this meeting:MD  The following were discussed:Patient's diagnosis: AKF, Patient's progosis: Unable to determine and Goals for treatment: Full Code  Additional follow-up to be provided: prn  Time spent during discussion:20 minutes  Gorden Harms, MD

## 2017-10-11 NOTE — Telephone Encounter (Signed)
Patient has called again asking for one of the nurses to call her back. Please call patient and advise.

## 2017-10-11 NOTE — ED Triage Notes (Signed)
Pt sent here for IV fluids for surgery on Wednesday morning for ileostomy reversal. Sent by Dr. [name] due to blood work results this AM.

## 2017-10-11 NOTE — ED Provider Notes (Signed)
[facility] Emergency Department Provider Note  Time seen: 2:04 PM  I have reviewed the triage vital signs and the nursing notes.   HISTORY  Chief Complaint Preop fluids?    HPI Brenda Gregory is a 77 y.o. female with a past medical history of diabetes, hypertension, CKD, presents to the emergency department for fluids.  According to the patient she is supposed to have an ileostomy reversal performed tomorrow.  Patient states she had blood work performed Friday by her surgeon for preop, was found to be somewhat abnormal so they had a return to the office this morning and then caught her told her she should go to the emergency department for IV fluids.  Patient is not sure what was abnormal on the blood work.  Denies any complaints.  States she has been hydrating well at home.  Denies any dizziness, lightheadedness, chest pain, trouble breathing, vomiting, diarrhea or dysuria.   Past Medical History:  Diagnosis Date  . Arthritis    knees, right foot  . Asthma    as child  . Diabetes mellitus without complication ([place])   . Diverticulitis of large intestine with perforation and abscess without bleeding 08/07/2017  . Family history of adverse reaction to anesthesia    daughter had adverse reaction to anesthesia after taking a blood pressure medication starting with "M"  . Gout   . Hypertension     Patient Active Problem List   Diagnosis Date Noted  . History of colon resection   . Diverticulosis of large intestine without diverticulitis   . Intestinal bypass or anastomosis status   . Diabetes ([place]) 07/15/2017  . HTN (hypertension) 07/15/2017  . Gout 07/15/2017  . Acute renal failure superimposed on chronic kidney disease ([place]) 07/15/2017  . CKD (chronic kidney disease) stage 3, GFR 30-59 ml/min (HCC) 01/21/2015  . Asthma, well controlled 01/21/2015  . Acute kidney injury (nontraumatic) ([place]) 01/13/2015    Past Surgical History:  Procedure Laterality  Date  . ABDOMINAL HYSTERECTOMY    . CATARACT EXTRACTION W/PHACO Left 02/25/2016   Procedure: CATARACT EXTRACTION PHACO AND INTRAOCULAR LENS PLACEMENT (IOC);  Surgeon: Eulogio Bear, MD;  Location: [facility];  Service: Ophthalmology;  Laterality: Left;  DIABETIC - oral meds LEFT  . CATARACT EXTRACTION W/PHACO Right 04/07/2016   Procedure: CATARACT EXTRACTION PHACO AND INTRAOCULAR LENS PLACEMENT (IOC);  Surgeon: Eulogio Bear, MD;  Location: [facility];  Service: Ophthalmology;  Laterality: Right;  RIGHT DIABETES - oral meds IVA TOPICAL  . COLECTOMY WITH COLOSTOMY CREATION/HARTMANN PROCEDURE Left 07/18/2017   Procedure: COLECTOMY WITH loop ileostomy;  Surgeon: Vickie Epley, MD;  Location: [facility];  Service: General;  Laterality: Left;  . COLONOSCOPY WITH PROPOFOL N/A 09/20/2017   Procedure: COLONOSCOPY WITH PROPOFOL;  Surgeon: Virgel Manifold, MD;  Location: [facility];  Service: Endoscopy;  Laterality: N/A;  . KNEE SURGERY Left     Prior to Admission medications   Medication Sig Start Date End Date Taking? Authorizing Provider  acetaminophen (TYLENOL) 650 MG CR tablet Take 650 mg by mouth every 8 (eight) hours as needed for pain.     [provider]  allopurinol (ZYLOPRIM) 300 MG tablet Take 300 mg by mouth daily.    [provider]  lisinopril (PRINIVIL,ZESTRIL) 20 MG tablet Take 20 mg by mouth daily.    [provider]  metFORMIN (GLUCOPHAGE) 500 MG tablet Take 500 mg by mouth daily with breakfast.    [provider]  No Known Allergies  Family History  Problem Relation Age of Onset  . Dementia Mother   . Heart attack Father   . Colon cancer Brother     Social History Social History   Tobacco Use  . Smoking status: Former Smoker    Last attempt to quit: 03/30/1978    Years since quitting: 39.5  . Smokeless tobacco: Never Used  Substance Use Topics  . Alcohol use: No  . Drug use: Never    Review of  Systems Constitutional: Negative for fever. Eyes: Negative for visual complaints ENT: Negative for recent illness/congestion Cardiovascular: Negative for chest pain. Respiratory: Negative for shortness of breath. Gastrointestinal: Negative for abdominal pain, vomiting and diarrhea. Genitourinary: Negative for urinary compaints Musculoskeletal: Negative for musculoskeletal complaints Skin: Negative for skin complaints  Neurological: Negative for headache All other ROS negative  ____________________________________________   PHYSICAL EXAM:  VITAL SIGNS: ED Triage Vitals  Enc Vitals Group     BP 10/11/17 1225 (!) 90/54     Pulse Rate 10/11/17 1225 89     Resp 10/11/17 1225 15     Temp 10/11/17 1225 98.2 F (36.8 C)     Temp Source 10/11/17 1225 Oral     SpO2 10/11/17 1225 97 %     Weight 10/11/17 1227 196 lb (88.9 kg)     Height 10/11/17 1227 5\' 5"  (1.651 m)     Head Circumference --      Peak Flow --      Pain Score 10/11/17 1227 0     Pain Loc --      Pain Edu? --      Excl. in [place]? --    Constitutional: Alert and oriented. Well appearing and in no distress. Eyes: Normal exam ENT   Head: Normocephalic and atraumatic.   Mouth/Throat: Mucous membranes are moist. Cardiovascular: Normal rate, regular rhythm.  Respiratory: Normal respiratory effort without tachypnea nor retractions. Breath sounds are clear  Gastrointestinal: Soft and nontender. No distention.   Musculoskeletal: Nontender with normal range of motion in all extremities.  Neurologic:  Normal speech and language. No gross focal neurologic deficits  Skin:  Skin is warm, dry and intact.  Psychiatric: Mood and affect are normal.   ____________________________________________    EKG  EKG reviewed and interpreted by myself shows normal sinus rhythm at 60 bpm with a narrow QRS, normal axis, normal intervals, no ST changes.  Normal EKG.  ____________________________________________  INITIAL IMPRESSION /  ASSESSMENT AND PLAN / ED COURSE  Pertinent labs & imaging results that were available during my care of the patient were reviewed by me and considered in my medical decision making (see chart for details).  Patient presents to the emergency department for IV fluids per patient.  I reviewed the patient's labs patient had a fairly high potassium of 6.0 on Friday down to 5.7 this morning.  Creatinine was slightly elevated over baseline as well.  Patient has a normal physical examination, has no complaints, negative review of systems.  We will IV hydrate in the emergency department.  Dose a second liter of IV fluids and then repeat a metabolic panel to evaluate potassium as well as kidney function.  We will also obtain an EKG today.  Labs have resulted showing a potassium of 6.3.  Given the patient continued hyperkalemia we will admit the patient to the hospital for further treatment.  We will dose insulin and glucose to help shift the potassium, Kayexalate as well.  No EKG changes  at this time.  CRITICAL CARE Performed by: Harvest Dark   Total critical care time: 30 minutes  Critical care time was exclusive of separately billable procedures and treating other patients.  Critical care was necessary to treat or prevent imminent or life-threatening deterioration.  Critical care was time spent personally by me on the following activities: development of treatment plan with patient and/or surrogate as well as nursing, discussions with consultants, evaluation of patient's response to treatment, examination of patient, obtaining history from patient or surrogate, ordering and performing treatments and interventions, ordering and review of laboratory studies, ordering and review of radiographic studies, pulse oximetry and re-evaluation of patient's condition.   ____________________________________________   FINAL CLINICAL IMPRESSION(S) / ED DIAGNOSES  Hyperkalemia Renal insufficiency     Harvest Dark, MD 10/11/17 7953

## 2017-10-11 NOTE — ED Notes (Signed)
Pt being transported to rm 255 at this time.

## 2017-10-11 NOTE — Telephone Encounter (Signed)
Called patient back to let her know that she needed to go to the ED since her labs continue to show elevated Potassium and Creatinine. Patient agreed and stated that she would go to the ED.  I will inform Dr. [name] about patient when he is out of the OR.

## 2017-10-11 NOTE — ED Notes (Signed)
Report called to [place] r[facility] floor nurse

## 2017-10-11 NOTE — Telephone Encounter (Signed)
Called patient back and told her that the only prep she is to do is to be on a clear liquid diet one day prior since all she is having is an Ileostomy takedown. I also told patient that she needed to go to the lab to have another blood drawn since Dr. [name] would like to know if patient is truly dehydrated or not. Patient stated that she will go this AM. Lab was ordered.

## 2017-10-11 NOTE — ED Notes (Signed)
ED Provider at bedside. 

## 2017-10-11 NOTE — Plan of Care (Signed)
  Problem: Education: Goal: Knowledge of General Education information will improve Outcome: Progressing   Problem: Health Behavior/Discharge Planning: Goal: Ability to manage health-related needs will improve Outcome: Progressing   Problem: Clinical Measurements: Goal: Will remain free from infection Outcome: Progressing   Problem: Skin Integrity: Goal: Risk for impaired skin integrity will decrease Outcome: Progressing

## 2017-10-11 NOTE — Pre-Procedure Instructions (Signed)
Called anesthesia and Dr [name] regarding need for medical clearance. " If she is admitted the hospitalitis will give clearance if sent home we need a medical clearance from primary care." per Dr Andree Elk.

## 2017-10-11 NOTE — Telephone Encounter (Signed)
Dr. [name] was informed of patient's labs and that she was referred to go to ED per Dr. Shann Medal request. Patient agreed and stated that she would go to the ED.

## 2017-10-11 NOTE — H&P (Signed)
[place] [facility] [place] NAME: Brenda Gregory    MR#:  [id number]  DATE OF BIRTH:  1940-04-11  DATE OF ADMISSION:  10/11/2017  PRIMARY CARE PHYSICIAN: Derinda Late, MD   REQUESTING/REFERRING PHYSICIAN:   CHIEF COMPLAINT:   Chief Complaint  Patient presents with  . Preop fluids?  High potassium  HISTORY OF PRESENT ILLNESS: Brenda Gregory  is a 77 y.o. female with a known history per below, sent to the emergency room for high potassium level noted on preoperative work-up for reversal of ileostomy, in the emergency room potassium was 6.3, creatinine 2, BUN 52, patient in no apparent distress, resting comfortably in bed, now being admitted for acute hyperkalemia, acute kidney injury.  PAST MEDICAL HISTORY:   Past Medical History:  Diagnosis Date  . Arthritis    knees, right foot  . Asthma    as child  . Diabetes mellitus without complication ([place])   . Diverticulitis of large intestine with perforation and abscess without bleeding 08/07/2017  . Family history of adverse reaction to anesthesia    daughter had adverse reaction to anesthesia after taking a blood pressure medication starting with "M"  . Gout   . Hypertension     PAST SURGICAL HISTORY:  Past Surgical History:  Procedure Laterality Date  . ABDOMINAL HYSTERECTOMY    . CATARACT EXTRACTION W/PHACO Left 02/25/2016   Procedure: CATARACT EXTRACTION PHACO AND INTRAOCULAR LENS PLACEMENT (IOC);  Surgeon: Eulogio Bear, MD;  Location: [facility];  Service: Ophthalmology;  Laterality: Left;  DIABETIC - oral meds LEFT  . CATARACT EXTRACTION W/PHACO Right 04/07/2016   Procedure: CATARACT EXTRACTION PHACO AND INTRAOCULAR LENS PLACEMENT (IOC);  Surgeon: Eulogio Bear, MD;  Location: [facility];  Service: Ophthalmology;  Laterality: Right;  RIGHT DIABETES - oral meds IVA TOPICAL  . COLECTOMY WITH COLOSTOMY CREATION/HARTMANN PROCEDURE Left 07/18/2017   Procedure:  COLECTOMY WITH loop ileostomy;  Surgeon: Vickie Epley, MD;  Location: [facility];  Service: General;  Laterality: Left;  . COLONOSCOPY WITH PROPOFOL N/A 09/20/2017   Procedure: COLONOSCOPY WITH PROPOFOL;  Surgeon: Virgel Manifold, MD;  Location: [facility];  Service: Endoscopy;  Laterality: N/A;  . KNEE SURGERY Left     SOCIAL HISTORY:  Social History   Tobacco Use  . Smoking status: Former Smoker    Last attempt to quit: 03/30/1978    Years since quitting: 39.5  . Smokeless tobacco: Never Used  Substance Use Topics  . Alcohol use: No    FAMILY HISTORY:  Family History  Problem Relation Age of Onset  . Dementia Mother   . Heart attack Father   . Colon cancer Brother     DRUG ALLERGIES: No Known Allergies  REVIEW OF SYSTEMS:   CONSTITUTIONAL: No fever, fatigue or weakness.  EYES: No blurred or double vision.  EARS, NOSE, AND THROAT: No tinnitus or ear pain.  RESPIRATORY: No cough, shortness of breath, wheezing or hemoptysis.  CARDIOVASCULAR: No chest pain, orthopnea, edema.  GASTROINTESTINAL: No nausea, vomiting, diarrhea or abdominal pain.  GENITOURINARY: No dysuria, hematuria.  ENDOCRINE: No polyuria, nocturia,  HEMATOLOGY: No anemia, easy bruising or bleeding SKIN: No rash or lesion. MUSCULOSKELETAL: No joint pain or arthritis.   NEUROLOGIC: No tingling, numbness, weakness.  PSYCHIATRY: No anxiety or depression.   MEDICATIONS AT HOME:  Prior to Admission medications   Medication Sig Start Date End Date Taking? Authorizing Provider  acetaminophen (TYLENOL) 650 MG CR tablet Take 650 mg by  mouth every 8 (eight) hours as needed for pain.     [provider]  allopurinol (ZYLOPRIM) 300 MG tablet Take 300 mg by mouth daily.    [provider]  lisinopril (PRINIVIL,ZESTRIL) 20 MG tablet Take 20 mg by mouth daily.    [provider]  metFORMIN (GLUCOPHAGE) 500 MG tablet Take 500 mg by mouth daily with breakfast.    [provider]      PHYSICAL EXAMINATION:   VITAL SIGNS: Blood pressure 115/69, pulse 77, temperature 98.2 F (36.8 C), temperature source Oral, resp. rate (!) 22, height 5\' 5"  (1.651 m), weight 88.9 kg (196 lb), SpO2 100 %.  GENERAL:  77 y.o.-year-old patient lying in the bed with no acute distress.  EYES: Pupils equal, round, reactive to light and accommodation. No scleral icterus. Extraocular muscles intact.  HEENT: Head atraumatic, normocephalic. Oropharynx and nasopharynx clear.  NECK:  Supple, no jugular venous distention. No thyroid enlargement, no tenderness.  LUNGS: Normal breath sounds bilaterally, no wheezing, rales,rhonchi or crepitation. No use of accessory muscles of respiration.  CARDIOVASCULAR: S1, S2 normal. No murmurs, rubs, or gallops.  ABDOMEN: Soft, nontender, nondistended. Bowel sounds present. No organomegaly or mass.  EXTREMITIES: No pedal edema, cyanosis, or clubbing.  NEUROLOGIC: Cranial nerves II through XII are intact. Muscle strength 5/5 in all extremities. Sensation intact. Gait not checked.  PSYCHIATRIC: The patient is alert and oriented x 3.  SKIN: No obvious rash, lesion, or ulcer.   LABORATORY PANEL:   CBC Recent Labs  Lab 10/08/17 1148 10/11/17 1228  WBC 6.4 6.6  HGB 12.2 12.3  HCT 37.5 35.6  PLT 216 215  MCV 91.4 90.6  MCH 29.8 31.4  MCHC 32.6 34.6  RDW 17.8* 17.7*  LYMPHSABS 1.5  --   MONOABS 0.5  --   EOSABS 0.2  --   BASOSABS 0.0  --    ------------------------------------------------------------------------------------------------------------------  Chemistries  Recent Labs  Lab 10/08/17 1148 10/11/17 1058 10/11/17 1631  NA 139 138 141  K 6.0* 5.7* 6.3*  CL 112* 113* 115*  CO2 20* 16* 18*  GLUCOSE 117* 162* 114*  BUN 57* 54* 52*  CREATININE 1.77* 2.15* 2.03*  CALCIUM 9.5 9.1 8.5*  AST 18 21  --   ALT 13 14  --   ALKPHOS 72 73  --   BILITOT 0.8 0.8  --     ------------------------------------------------------------------------------------------------------------------ estimated creatinine clearance is 26.4 mL/min (A) (by C-G formula based on SCr of 2.03 mg/dL (H)). ------------------------------------------------------------------------------------------------------------------ No results for input(s): TSH, T4TOTAL, T3FREE, THYROIDAB in the last 72 hours.  Invalid input(s): FREET3   Coagulation profile No results for input(s): INR, PROTIME in the last 168 hours. ------------------------------------------------------------------------------------------------------------------- No results for input(s): DDIMER in the last 72 hours. -------------------------------------------------------------------------------------------------------------------  Cardiac Enzymes No results for input(s): CKMB, TROPONINI, MYOGLOBIN in the last 168 hours.  Invalid input(s): CK ------------------------------------------------------------------------------------------------------------------ Invalid input(s): POCBNP  ---------------------------------------------------------------------------------------------------------------  Urinalysis    Component Value Date/Time   COLORURINE YELLOW (A) 07/17/2017 0542   APPEARANCEUR CLOUDY (A) 07/17/2017 0542   LABSPEC 1.020 07/17/2017 0542   PHURINE 5.0 07/17/2017 0542   GLUCOSEU NEGATIVE 07/17/2017 0542   HGBUR NEGATIVE 07/17/2017 0542   BILIRUBINUR NEGATIVE 07/17/2017 0542   KETONESUR NEGATIVE 07/17/2017 0542   PROTEINUR NEGATIVE 07/17/2017 0542   NITRITE NEGATIVE 07/17/2017 0542   LEUKOCYTESUR TRACE (A) 07/17/2017 0542     RADIOLOGY: No results found.  EKG: Orders placed or performed during the hospital encounter of 10/11/17  . ED EKG  .  ED EKG    IMPRESSION AND PLAN: *Acute kidney injury Exact etiology unknown Admit to telemetry bed, hold lisinopril, IV fluids for rehydration, avoid  nephrotoxic agents, strict I&O monitoring, BMP in the morning, check renal ultrasound, nephrology to see, continue close medical monitoring  *Acute hyperkalemia Suspect secondary to lisinopril use  Hold lisinopril, IV fluids for rehydration, Kayexalate x1, [place] daily x 2 doses, BMP in the morning  *Chronic diabetes mellitus type 2 Hold metformin, sliding scale insulin with Accu-Cheks per routine  *History of asthma without exacerbation Stable  Breathing treatments as needed  disposition Home in 1 to 2 days barring any complications    All the records are reviewed and case discussed with ED provider. Management plans discussed with the patient, family and they are in agreement.  CODE STATUS:full Code Status History    Date Active Date Inactive Code Status Order ID Comments User Context   07/15/2017 1817 07/24/2017 1902 Full Code [id number]  Vickie Epley, MD ED       TOTAL TIME TAKING CARE OF THIS PATIENT: 45 minutes.    Avel Peace Corbett Moulder M.D on 10/11/2017   Between 7am to 6pm - Pager - [phone]  After 6pm go to [email or web address] - password [email or web address] [facility]  Office  [phone]  CC: Primary care physician; Derinda Late, MD   Note: This dictation was prepared with Dragon dictation along with smaller phrase technology. Any transcriptional errors that result from this process are unintentional.

## 2017-10-11 NOTE — Pre-Procedure Instructions (Addendum)
Called Dr Andree Elk regarding potassium of 6. Medical clearance requested.  Called Dr Shann Medal office.  "Patient being sent to ER due to elevated Potassium level, and increase in creatine of 2.5. per Dr Shann Medal office.  In ER for dehydration.  Dr Baldemar Lenis needs to see patient before giving a medical clearance.

## 2017-10-12 ENCOUNTER — Telehealth: Payer: Self-pay

## 2017-10-12 ENCOUNTER — Inpatient Hospital Stay: Payer: Medicare HMO

## 2017-10-12 ENCOUNTER — Other Ambulatory Visit: Payer: Self-pay

## 2017-10-12 DIAGNOSIS — E119 Type 2 diabetes mellitus without complications: Secondary | ICD-10-CM | POA: Diagnosis not present

## 2017-10-12 DIAGNOSIS — N179 Acute kidney failure, unspecified: Secondary | ICD-10-CM | POA: Diagnosis not present

## 2017-10-12 DIAGNOSIS — E875 Hyperkalemia: Secondary | ICD-10-CM | POA: Diagnosis not present

## 2017-10-12 DIAGNOSIS — J45909 Unspecified asthma, uncomplicated: Secondary | ICD-10-CM | POA: Diagnosis not present

## 2017-10-12 LAB — BASIC METABOLIC PANEL
ANION GAP: 5 (ref 5–15)
BUN: 47 mg/dL — ABNORMAL HIGH (ref 8–23)
CALCIUM: 9.1 mg/dL (ref 8.9–10.3)
CO2: 18 mmol/L — AB (ref 22–32)
CREATININE: 1.83 mg/dL — AB (ref 0.44–1.00)
Chloride: 115 mmol/L — ABNORMAL HIGH (ref 98–111)
GFR, EST AFRICAN AMERICAN: 30 mL/min — AB (ref >60)
GFR, EST NON AFRICAN AMERICAN: 26 mL/min — AB (ref >60)
Glucose, Bld: 94 mg/dL (ref 70–99)
Potassium: 5.4 mmol/L — ABNORMAL HIGH (ref 3.5–5.1)
SODIUM: 138 mmol/L (ref 135–145)

## 2017-10-12 LAB — GLUCOSE, CAPILLARY
GLUCOSE-CAPILLARY: 67 mg/dL — AB (ref 70–99)
Glucose-Capillary: 86 mg/dL (ref 70–99)

## 2017-10-12 NOTE — Telephone Encounter (Signed)
One of the nurses from Pre-Admit called stating that patient's Potassium and Creatinine were elevated. Therefore, they were requesting Medical Clearance. I told the nurse that Dr. Davis was aware and that's why we had called her to let her know that she needed a lab repeat to make sure. Once we received results, I informed Dr. Davis and he recommended for the patient to go to the ED because she could probably be dehydrated. When I spoke with Dr. Davis, I told him that Anesthesia was requesting medical clearance. Dr. Davis stated that he would get in contact with the anesthesiologist in reference to the medical clearance requested since he did not think patient needed one. 

## 2017-10-12 NOTE — Pre-Procedure Instructions (Signed)
Call received from Dr Elzie Rings office, patient will need an appointment for clearance if discharged from hospital without surgery.

## 2017-10-12 NOTE — Plan of Care (Addendum)
Rounded with MD, no complains of pain throughout this shift, pt resting in room comfortably, patient scheduled for reversal of diverting loop ileostomy in the morning.  Problem: Education: Goal: Knowledge of General Education information will improve Outcome: Progressing   Problem: Health Behavior/Discharge Planning: Goal: Ability to manage health-related needs will improve Outcome: Progressing   Problem: Clinical Measurements: Goal: Will remain free from infection Outcome: Progressing

## 2017-10-12 NOTE — Progress Notes (Signed)
Hypoglycemic Event  CBG: 69  Treatment: OJ  Symptoms: None  Follow-up CBG: [id number] CBG Result:89  Possible Reasons for Event: Unknown  Comments/MD notified    Brenda Gregory

## 2017-10-12 NOTE — Progress Notes (Signed)
[place] at [place] NAME: Brenda Gregory    MR#:  [id number]  DATE OF BIRTH:  Sep 28, 1940  SUBJECTIVE:   Patient presented to the hospital due to abnormal blood work.  Patient is supposed to have a reversal of her ileostomy/colostomy and preoperatively blood work was noted to be consistent with mild hyperkalemia with acute kidney injury.  Patient clinically denies any complaints presently.  Potassium level and renal function is improved since yesterday.  REVIEW OF SYSTEMS:    Review of Systems  Constitutional: Negative for chills and fever.  HENT: Negative for congestion and tinnitus.   Eyes: Negative for blurred vision and double vision.  Respiratory: Negative for cough, shortness of breath and wheezing.   Cardiovascular: Negative for chest pain, orthopnea and PND.  Gastrointestinal: Negative for abdominal pain, diarrhea, nausea and vomiting.  Genitourinary: Negative for dysuria and hematuria.  Neurological: Negative for dizziness, sensory change and focal weakness.  All other systems reviewed and are negative.   Nutrition: Clear Liqiuds Tolerating Diet: Yes Tolerating PT: Await Eval.   DRUG ALLERGIES:  No Known Allergies  VITALS:  Blood pressure (!) 116/58, pulse (!) 59, temperature 98.1 F (36.7 C), temperature source Oral, resp. rate 18, height 5\' 5"  (1.651 m), weight 89.6 kg (197 lb 9.6 oz), SpO2 100 %.  PHYSICAL EXAMINATION:   Physical Exam  GENERAL:  77 y.o.-year-old patient lying in bed in no acute distress.  EYES: Pupils equal, round, reactive to light and accommodation. No scleral icterus. Extraocular muscles intact.  HEENT: Head atraumatic, normocephalic. Oropharynx and nasopharynx clear.  NECK:  Supple, no jugular venous distention. No thyroid enlargement, no tenderness.  LUNGS: Normal breath sounds bilaterally, no wheezing, rales, rhonchi. No use of accessory muscles of respiration.  CARDIOVASCULAR: S1, S2 normal. No  murmurs, rubs, or gallops.  ABDOMEN: Soft, nontender, nondistended. Bowel sounds present. No organomegaly or mass. + left sided colostomy in place with liquid stool noted.  EXTREMITIES: No cyanosis, clubbing or edema b/l.    NEUROLOGIC: Cranial nerves II through XII are intact. No focal Motor or sensory deficits b/l. Globally weak  PSYCHIATRIC: The patient is alert and oriented x 3.  SKIN: No obvious rash, lesion, or ulcer.    LABORATORY PANEL:   CBC Recent Labs  Lab 10/11/17 1228  WBC 6.6  HGB 12.3  HCT 35.6  PLT 215   ------------------------------------------------------------------------------------------------------------------  Chemistries  Recent Labs  Lab 10/11/17 1058  10/12/17 0422  NA 138   < > 138  K 5.7*   < > 5.4*  CL 113*   < > 115*  CO2 16*   < > 18*  GLUCOSE 162*   < > 94  BUN 54*   < > 47*  CREATININE 2.15*   < > 1.83*  CALCIUM 9.1   < > 9.1  AST 21  --   --   ALT 14  --   --   ALKPHOS 73  --   --   BILITOT 0.8  --   --    < > = values in this interval not displayed.   ------------------------------------------------------------------------------------------------------------------  Cardiac Enzymes No results for input(s): TROPONINI in the last 168 hours. ------------------------------------------------------------------------------------------------------------------  RADIOLOGY:  US Renal  Result Date: 10/12/2017 CLINICAL DATA:  Acute kidney injury EXAM: RENAL / URINARY TRACT ULTRASOUND COMPLETE COMPARISON:  None. FINDINGS: Right Kidney: Length: 8.9 cm. Renal cortical thinning. Increased renal cortical echogenicity. No mass or hydronephrosis visualized. Left Kidney: Length: 10.3 cm.  Renal cortical thinning. Increased renal cortical echogenicity. No mass or hydronephrosis visualized. Bladder: Appears normal for degree of bladder distention. IMPRESSION: Increased renal cortical echogenicity with renal cortical thinning as can be seen with medical  renal disease. Electronically Signed   By: Kathreen Devoid   On: 10/12/2017 10:10     ASSESSMENT AND PLAN:   77 year old female with past medical history of hypertension, gout, asthma, osteoarthritis, previous history of a diverticulitis with abscess status post partial colectomy with colostomy who presented to the hospital due to abnormal blood work and noted to be hyperkalemic.  1.  Hyperkalemia- secondary to acute on chronic kidney injury combined with patient being on ace inhibitors. - Improved since yesterday, continue Veltassa.  No alarms on telemetry.  2.  Acute on chronic renal failure-patient's baseline creatinine is 1.5-1.7.  Patient presented to the hospital with a creatinine of 2.1.  Continue IV fluids and creatinine is trending down.  Continue to hold ACE inhibitor for now. - Renal Ultrasound showing no obstruction or hydronephrosis.  3.  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  Seen by surgery and plan for doing that tomorrow while she is in the hospital.  4.  Diabetes type 2 without complication-continue sliding scale insulin.  5.  Gout-no acute attack.  Continue allopurinol.     All the records are reviewed and case discussed with Care Management/Social Worker. Management plans discussed with the patient, family and they are in agreement.  CODE STATUS: Full code  DVT Prophylaxis: Heparin subcu  TOTAL TIME TAKING CARE OF THIS PATIENT: 30 minutes.   POSSIBLE D/C IN 2-3 DAYS, DEPENDING ON CLINICAL CONDITION.   Henreitta Leber M.D on 10/12/2017 at 2:37 PM  Between 7am to 6pm - Pager - [phone]  After 6pm go to [email or web address] - Proofreader  [facility]  Office  [phone]  CC: Primary care physician; Derinda Late, MD

## 2017-10-13 ENCOUNTER — Telehealth: Payer: Self-pay | Admitting: Surgery

## 2017-10-13 ENCOUNTER — Encounter: Payer: Self-pay | Admitting: *Deleted

## 2017-10-13 ENCOUNTER — Inpatient Hospital Stay: Payer: Medicare HMO

## 2017-10-13 ENCOUNTER — Encounter
Admission: EM | Disposition: A | Discharge status: Home / Self Care | Payer: Self-pay | Source: Home / Self Care | Attending: Surgery

## 2017-10-13 ENCOUNTER — Inpatient Hospital Stay: Admission: RE | Admit: 2017-10-13 | Payer: Medicare HMO | Source: Ambulatory Visit | Admitting: Surgery

## 2017-10-13 DIAGNOSIS — Z932 Ileostomy status: Secondary | ICD-10-CM

## 2017-10-13 DIAGNOSIS — K5792 Diverticulitis of intestine, part unspecified, without perforation or abscess without bleeding: Secondary | ICD-10-CM | POA: Diagnosis not present

## 2017-10-13 DIAGNOSIS — E1122 Type 2 diabetes mellitus with diabetic chronic kidney disease: Secondary | ICD-10-CM | POA: Diagnosis not present

## 2017-10-13 DIAGNOSIS — N183 Chronic kidney disease, stage 3 (moderate): Secondary | ICD-10-CM | POA: Diagnosis not present

## 2017-10-13 DIAGNOSIS — I129 Hypertensive chronic kidney disease with stage 1 through stage 4 chronic kidney disease, or unspecified chronic kidney disease: Secondary | ICD-10-CM | POA: Diagnosis not present

## 2017-10-13 HISTORY — PX: ILEOSTOMY CLOSURE: SHX1784

## 2017-10-13 LAB — GLUCOSE, CAPILLARY
GLUCOSE-CAPILLARY: 87 mg/dL (ref 70–99)
GLUCOSE-CAPILLARY: 157 mg/dL — AB (ref 70–99)
Glucose-Capillary: 80 mg/dL (ref 70–99)
Glucose-Capillary: 72 mg/dL (ref 70–99)
Glucose-Capillary: 142 mg/dL — ABNORMAL HIGH (ref 70–99)
Glucose-Capillary: 145 mg/dL — ABNORMAL HIGH (ref 70–99)
Glucose-Capillary: 135 mg/dL — ABNORMAL HIGH (ref 70–99)

## 2017-10-13 LAB — BASIC METABOLIC PANEL
Anion gap: 3 — ABNORMAL LOW (ref 5–15)
BUN: 32 mg/dL — AB (ref 8–23)
CALCIUM: 8.7 mg/dL — AB (ref 8.9–10.3)
CO2: 20 mmol/L — ABNORMAL LOW (ref 22–32)
CREATININE: 1.52 mg/dL — AB (ref 0.44–1.00)
Chloride: 114 mmol/L — ABNORMAL HIGH (ref 98–111)
GFR, EST AFRICAN AMERICAN: 38 mL/min — AB (ref >60)
GFR, EST NON AFRICAN AMERICAN: 32 mL/min — AB (ref >60)
Glucose, Bld: 99 mg/dL (ref 70–99)
Potassium: 5 mmol/L (ref 3.5–5.1)
SODIUM: 137 mmol/L (ref 135–145)

## 2017-10-13 LAB — HEMOGLOBIN A1C
HEMOGLOBIN A1C: 6.7 % — AB (ref 4.8–5.6)
Mean Plasma Glucose: 146 mg/dL

## 2017-10-13 SURGERY — CLOSURE, ILEOSTOMY
Anesthesia: General | Wound class: "Dirty or Infected "

## 2017-10-13 MED ORDER — SUGAMMADEX SODIUM 200 MG/2ML IV SOLN
INTRAVENOUS | Status: DC | PRN
  Administered 2017-10-13: 178.4 mg via INTRAVENOUS

## 2017-10-13 MED ORDER — ACETAMINOPHEN 325 MG PO TABS: 325.0000 mg | ORAL_TABLET | ORAL | Status: DC | PRN

## 2017-10-13 MED ORDER — FENTANYL CITRATE (PF) 100 MCG/2ML IJ SOLN
INTRAMUSCULAR | Status: AC
  Filled 2017-10-13: qty 2

## 2017-10-13 MED ORDER — LIDOCAINE HCL (PF) 2 % IJ SOLN
INTRAMUSCULAR | Status: AC
  Filled 2017-10-13: qty 10

## 2017-10-13 MED ORDER — CHLORHEXIDINE GLUCONATE CLOTH 2 % EX PADS: 6.0000 | MEDICATED_PAD | Freq: Once | CUTANEOUS | Status: DC

## 2017-10-13 MED ORDER — ACETAMINOPHEN 160 MG/5ML PO SOLN
325.0000 mg | ORAL | Status: DC | PRN
  Filled 2017-10-13: qty 20.3

## 2017-10-13 MED ORDER — SODIUM CHLORIDE 0.9 % IJ SOLN
INTRAMUSCULAR | Status: AC
  Filled 2017-10-13: qty 30

## 2017-10-13 MED ORDER — GABAPENTIN 300 MG PO CAPS
300.0000 mg | ORAL_CAPSULE | ORAL | Status: AC
  Administered 2017-10-13: 300 mg via ORAL
  Filled 2017-10-13: qty 1

## 2017-10-13 MED ORDER — ALVIMOPAN 12 MG PO CAPS
12.0000 mg | ORAL_CAPSULE | ORAL | Status: AC
  Administered 2017-10-13: 12 mg via ORAL
  Filled 2017-10-13: qty 1

## 2017-10-13 MED ORDER — LIDOCAINE HCL (PF) 1 % IJ SOLN
INTRAMUSCULAR | Status: AC
Start: 2017-10-13 — End: ?
  Filled 2017-10-13: qty 30

## 2017-10-13 MED ORDER — PROPOFOL 10 MG/ML IV BOLUS
INTRAVENOUS | Status: AC
  Filled 2017-10-13: qty 20

## 2017-10-13 MED ORDER — EPHEDRINE SULFATE 50 MG/ML IJ SOLN
INTRAMUSCULAR | Status: DC | PRN
  Administered 2017-10-13: 5 mg via INTRAVENOUS
  Administered 2017-10-13: 10 mg via INTRAVENOUS
  Administered 2017-10-13: 5 mg via INTRAVENOUS

## 2017-10-13 MED ORDER — EPHEDRINE SULFATE 50 MG/ML IJ SOLN
INTRAMUSCULAR | Status: AC
  Filled 2017-10-13: qty 1

## 2017-10-13 MED ORDER — OXYCODONE HCL 5 MG PO TABS: 5.0000 mg | ORAL_TABLET | ORAL | Status: DC | PRN

## 2017-10-13 MED ORDER — LIDOCAINE HCL 1 % IJ SOLN
INTRAMUSCULAR | Status: DC | PRN
  Administered 2017-10-13: 10 mL

## 2017-10-13 MED ORDER — SUGAMMADEX SODIUM 200 MG/2ML IV SOLN
INTRAVENOUS | Status: AC
  Filled 2017-10-13: qty 2

## 2017-10-13 MED ORDER — FAMOTIDINE 20 MG PO TABS
20.0000 mg | ORAL_TABLET | Freq: Once | ORAL | Status: AC
  Administered 2017-10-13: 20 mg via ORAL
  Filled 2017-10-13: qty 1

## 2017-10-13 MED ORDER — MORPHINE SULFATE (PF) 2 MG/ML IV SOLN
2.0000 mg | INTRAVENOUS | Status: DC | PRN
  Administered 2017-10-13: 2 mg via INTRAVENOUS
  Filled 2017-10-13: qty 1

## 2017-10-13 MED ORDER — DEXAMETHASONE SODIUM PHOSPHATE 10 MG/ML IJ SOLN
INTRAMUSCULAR | Status: DC | PRN
  Administered 2017-10-13: 5 mg via INTRAVENOUS

## 2017-10-13 MED ORDER — DEXAMETHASONE SODIUM PHOSPHATE 10 MG/ML IJ SOLN
INTRAMUSCULAR | Status: AC
  Filled 2017-10-13: qty 1

## 2017-10-13 MED ORDER — CEFAZOLIN SODIUM-DEXTROSE 2-4 GM/100ML-% IV SOLN
2.0000 g | Freq: Three times a day (TID) | INTRAVENOUS | Status: AC
  Administered 2017-10-13 (×2): 2 g via INTRAVENOUS
  Filled 2017-10-13 (×2): qty 100

## 2017-10-13 MED ORDER — LIDOCAINE HCL (CARDIAC) PF 100 MG/5ML IV SOSY
PREFILLED_SYRINGE | INTRAVENOUS | Status: DC | PRN
  Administered 2017-10-13: 50 mg via INTRAVENOUS

## 2017-10-13 MED ORDER — HYDROMORPHONE HCL 1 MG/ML IJ SOLN
INTRAMUSCULAR | Status: AC
  Administered 2017-10-13: 0.25 mg via INTRAVENOUS
  Filled 2017-10-13: qty 1

## 2017-10-13 MED ORDER — CHLORHEXIDINE GLUCONATE CLOTH 2 % EX PADS
6.0000 | MEDICATED_PAD | Freq: Once | CUTANEOUS | Status: AC
  Administered 2017-10-13: 6 via TOPICAL

## 2017-10-13 MED ORDER — ACETAMINOPHEN 500 MG PO TABS
1000.0000 mg | ORAL_TABLET | Freq: Four times a day (QID) | ORAL | Status: DC
  Administered 2017-10-13 – 2017-10-16 (×8): 1000 mg via ORAL
  Filled 2017-10-13 (×9): qty 2

## 2017-10-13 MED ORDER — PROPOFOL 10 MG/ML IV BOLUS
INTRAVENOUS | Status: DC | PRN
  Administered 2017-10-13: 150 mg via INTRAVENOUS

## 2017-10-13 MED ORDER — BUPIVACAINE-EPINEPHRINE (PF) 0.5% -1:200000 IJ SOLN
INTRAMUSCULAR | Status: AC
  Filled 2017-10-13: qty 30

## 2017-10-13 MED ORDER — ROCURONIUM BROMIDE 50 MG/5ML IV SOLN
INTRAVENOUS | Status: AC
  Filled 2017-10-13: qty 1

## 2017-10-13 MED ORDER — PHENYLEPHRINE HCL 10 MG/ML IJ SOLN
INTRAMUSCULAR | Status: AC
  Filled 2017-10-13: qty 1

## 2017-10-13 MED ORDER — MIDAZOLAM HCL 2 MG/2ML IJ SOLN
INTRAMUSCULAR | Status: DC | PRN
  Administered 2017-10-13: 1 mg via INTRAVENOUS

## 2017-10-13 MED ORDER — ONDANSETRON HCL 4 MG/2ML IJ SOLN
INTRAMUSCULAR | Status: DC | PRN
  Administered 2017-10-13: 4 mg via INTRAVENOUS

## 2017-10-13 MED ORDER — MEPERIDINE HCL 50 MG/ML IJ SOLN: 6.2500 mg | INTRAMUSCULAR | Status: DC | PRN

## 2017-10-13 MED ORDER — ONDANSETRON HCL 4 MG/2ML IJ SOLN: 4.0000 mg | Freq: Four times a day (QID) | INTRAMUSCULAR | Status: DC | PRN

## 2017-10-13 MED ORDER — MIDAZOLAM HCL 2 MG/2ML IJ SOLN
INTRAMUSCULAR | Status: AC
  Filled 2017-10-13: qty 2

## 2017-10-13 MED ORDER — ACETAMINOPHEN 500 MG PO TABS
500.0000 mg | ORAL_TABLET | Freq: Once | ORAL | Status: DC
  Filled 2017-10-13: qty 1

## 2017-10-13 MED ORDER — ACETAMINOPHEN 500 MG PO TABS
1000.0000 mg | ORAL_TABLET | ORAL | Status: AC
  Administered 2017-10-13: 1000 mg via ORAL
  Filled 2017-10-13: qty 2

## 2017-10-13 MED ORDER — ROCURONIUM BROMIDE 100 MG/10ML IV SOLN
INTRAVENOUS | Status: DC | PRN
  Administered 2017-10-13: 20 mg via INTRAVENOUS
  Administered 2017-10-13: 50 mg via INTRAVENOUS

## 2017-10-13 MED ORDER — ONDANSETRON HCL 4 MG/2ML IJ SOLN
INTRAMUSCULAR | Status: AC
  Filled 2017-10-13: qty 2

## 2017-10-13 MED ORDER — PHENYLEPHRINE HCL 10 MG/ML IJ SOLN
INTRAMUSCULAR | Status: DC | PRN
  Administered 2017-10-13: 50 ug via INTRAVENOUS
  Administered 2017-10-13: 100 ug via INTRAVENOUS

## 2017-10-13 MED ORDER — ONDANSETRON 4 MG PO TBDP
4.0000 mg | ORAL_TABLET | Freq: Four times a day (QID) | ORAL | Status: DC | PRN
  Filled 2017-10-13: qty 1

## 2017-10-13 MED ORDER — DEXMEDETOMIDINE HCL IN NACL 200 MCG/50ML IV SOLN
INTRAVENOUS | Status: AC
  Filled 2017-10-13: qty 50

## 2017-10-13 MED ORDER — FENTANYL CITRATE (PF) 100 MCG/2ML IJ SOLN
INTRAMUSCULAR | Status: DC | PRN
  Administered 2017-10-13 (×2): 50 ug via INTRAVENOUS
  Administered 2017-10-13 (×3): 25 ug via INTRAVENOUS

## 2017-10-13 MED ORDER — CEFAZOLIN SODIUM-DEXTROSE 2-4 GM/100ML-% IV SOLN
2.0000 g | INTRAVENOUS | Status: AC
  Administered 2017-10-13: 2 g via INTRAVENOUS
  Filled 2017-10-13: qty 100

## 2017-10-13 MED ORDER — HYDROCODONE-ACETAMINOPHEN 7.5-325 MG PO TABS: 1.0000 | ORAL_TABLET | Freq: Once | ORAL | Status: DC | PRN

## 2017-10-13 MED ORDER — BUPIVACAINE HCL (PF) 0.5 % IJ SOLN
INTRAMUSCULAR | Status: DC | PRN
  Administered 2017-10-13: 10 mL

## 2017-10-13 MED ORDER — BUPIVACAINE HCL (PF) 0.5 % IJ SOLN
INTRAMUSCULAR | Status: AC
  Filled 2017-10-13: qty 30

## 2017-10-13 MED ORDER — ENOXAPARIN SODIUM 40 MG/0.4ML ~~LOC~~ SOLN
40.0000 mg | SUBCUTANEOUS | Status: DC
  Administered 2017-10-14 – 2017-10-16 (×3): 40 mg via SUBCUTANEOUS
  Filled 2017-10-13 (×3): qty 0.4

## 2017-10-13 MED ORDER — PROMETHAZINE HCL 25 MG/ML IJ SOLN: 6.2500 mg | INTRAMUSCULAR | Status: DC | PRN

## 2017-10-13 MED ORDER — HYDROMORPHONE HCL 1 MG/ML IJ SOLN
0.2500 mg | INTRAMUSCULAR | Status: DC | PRN
  Administered 2017-10-13 (×3): 0.25 mg via INTRAVENOUS

## 2017-10-13 MED ORDER — LACTATED RINGERS IV SOLN
INTRAVENOUS | Status: DC | PRN
  Administered 2017-10-13: 08:00:00 via INTRAVENOUS

## 2017-10-13 SURGICAL SUPPLY — 38 items
CHLORAPREP W/TINT 26ML (MISCELLANEOUS) ×3 IMPLANT
DRSG OPSITE POSTOP 4X10 (GAUZE/BANDAGES/DRESSINGS) IMPLANT
DRSG OPSITE POSTOP 4X6 (GAUZE/BANDAGES/DRESSINGS) ×2 IMPLANT
DRSG OPSITE POSTOP 4X8 (GAUZE/BANDAGES/DRESSINGS) IMPLANT
ELECT BLADE 6 FLAT ULTRCLN (ELECTRODE) ×2 IMPLANT
ELECT REM PT RETURN 9FT ADLT (ELECTROSURGICAL) ×3
ELECTRODE REM PT RTRN 9FT ADLT (ELECTROSURGICAL) ×1 IMPLANT
EXTRT SYSTEM ALEXIS 17CM (MISCELLANEOUS) ×3
GLOVE BIO SURGEON STRL SZ7 (GLOVE) ×6 IMPLANT
GLOVE BIOGEL PI IND STRL 7.5 (GLOVE) ×2 IMPLANT
GLOVE BIOGEL PI INDICATOR 7.5 (GLOVE) ×4
GOWN STRL REUS W/TWL LRG LVL3 (GOWN DISPOSABLE) ×18 IMPLANT
HANDLE SUCTION POOLE (INSTRUMENTS) ×1 IMPLANT
HANDLE YANKAUER SUCT BULB TIP (MISCELLANEOUS) ×3 IMPLANT
NDL HYPO 18GX1.5 BLUNT FILL (NEEDLE) ×1 IMPLANT
NDL HYPO 21X1.5 SAFETY (NEEDLE) ×1 IMPLANT
NEEDLE HYPO 18GX1.5 BLUNT FILL (NEEDLE) ×3 IMPLANT
NEEDLE HYPO 21X1.5 SAFETY (NEEDLE) ×3 IMPLANT
NS IRRIG 1000ML POUR BTL (IV SOLUTION) ×6 IMPLANT
PACK BASIN MAJOR ARMC (MISCELLANEOUS) ×3 IMPLANT
PACK COLON CLEAN CLOSURE (MISCELLANEOUS) ×3 IMPLANT
RELOAD LINEAR CUT PROX 55 BLUE (ENDOMECHANICALS) ×3 IMPLANT
RELOAD PROXIMATE 75MM BLUE (ENDOMECHANICALS) IMPLANT
RELOAD STAPLE 55 3.8 BLU REG (ENDOMECHANICALS) IMPLANT
RELOAD STAPLE 75 3.8 BLU REG (ENDOMECHANICALS) IMPLANT
SPONGE LAP 18X18 RF (DISPOSABLE) ×3 IMPLANT
STAPLER PROXIMATE 55 BLUE (STAPLE) ×2 IMPLANT
SUCTION POOLE HANDLE (INSTRUMENTS) ×3
SUT PDS AB 1 TP1 96 (SUTURE) ×3 IMPLANT
SUT SILK 2 0 (SUTURE) ×2
SUT SILK 2-0 18XBRD TIE 12 (SUTURE) IMPLANT
SUT SILK 3-0 (SUTURE) ×3 IMPLANT
SUT VIC AB 3-0 SH 27 (SUTURE) ×4
SUT VIC AB 3-0 SH 27X BRD (SUTURE) ×2 IMPLANT
SYR 20CC LL (SYRINGE) ×3 IMPLANT
SYSTEM CONTND EXTRCTN KII BLLN (MISCELLANEOUS) ×1 IMPLANT
TOWEL OR 17X26 4PK STRL BLUE (TOWEL DISPOSABLE) ×3 IMPLANT
TRAY FOLEY MTR SLVR 16FR STAT (SET/KITS/TRAYS/PACK) ×3 IMPLANT

## 2017-10-13 NOTE — Transfer of Care (Signed)
Immediate Anesthesia Transfer of Care Note  Patient: Brenda Gregory  Procedure(s) Performed: ILEOSTOMY TAKEDOWN (N/A )  Patient Location: PACU  Anesthesia Type:General  Level of Consciousness: sedated  Airway & Oxygen Therapy: Patient Spontanous Breathing and Patient connected to face mask oxygen  Post-op Assessment: Report given to RN and Post -op Vital signs reviewed and stable  Post vital signs: Reviewed  Last Vitals:  Vitals Value Taken Time  BP 115/51 10/13/2017 10:36 AM  Temp    Pulse 66 10/13/2017 10:36 AM  Resp 11 10/13/2017 10:36 AM  SpO2 99 % 10/13/2017 10:36 AM  Vitals shown include unvalidated device data.  Last Pain:  Vitals:   10/13/17 0743  TempSrc:   PainSc: 0-No pain      Patients Stated Pain Goal: 0 (17/40/81 4481)  Complications: No apparent anesthesia complications

## 2017-10-13 NOTE — Telephone Encounter (Signed)
-----   Message from Larna Daughters sent at 10/13/2017  8:56 AM EDT ----- Janett Billow with [employer] is calling in regards to this patient she can be reached at [phone] ext# [phone].

## 2017-10-13 NOTE — Progress Notes (Signed)
[place] at [place] NAME: Brenda Gregory    MR#:  [id number]  DATE OF BIRTH:  29-May-1940  SUBJECTIVE:   Status post reversal of her loop ileostomy.  Seen postoperatively and somewhat lethargic.  Patient's renal function and potassium has improved since yesterday.  No other acute events overnight are present.  REVIEW OF SYSTEMS:    Review of Systems  Constitutional: Negative for chills and fever.  HENT: Negative for congestion and tinnitus.   Eyes: Negative for blurred vision and double vision.  Respiratory: Negative for cough, shortness of breath and wheezing.   Cardiovascular: Negative for chest pain, orthopnea and PND.  Gastrointestinal: Negative for abdominal pain, diarrhea, nausea and vomiting.  Genitourinary: Negative for dysuria and hematuria.  Neurological: Negative for dizziness, sensory change and focal weakness.  All other systems reviewed and are negative.   Nutrition: NPO except Ice and meds Tolerating Diet: Yes Tolerating PT: Await Eval.   DRUG ALLERGIES:  No Known Allergies  VITALS:  Blood pressure 105/60, pulse 64, temperature (!) 97.1 F (36.2 C), resp. rate 18, height 5\' 5"  (1.651 m), weight 89.2 kg (196 lb 11.2 oz), SpO2 96 %.  PHYSICAL EXAMINATION:   Physical Exam  GENERAL:  77 y.o.-year-old patient lying in bed in no acute distress.  EYES: Pupils equal, round, reactive to light and accommodation. No scleral icterus. Extraocular muscles intact.  HEENT: Head atraumatic, normocephalic. Oropharynx and nasopharynx clear.  NECK:  Supple, no jugular venous distention. No thyroid enlargement, no tenderness.  LUNGS: Normal breath sounds bilaterally, no wheezing, rales, rhonchi. No use of accessory muscles of respiration.  CARDIOVASCULAR: S1, S2 normal. No murmurs, rubs, or gallops.  ABDOMEN: Soft, nontender, nondistended. Bowel sounds present. No organomegaly or mass. Dressing on left side of abdomen.  EXTREMITIES:  No cyanosis, clubbing or edema b/l.    NEUROLOGIC: Cranial nerves II through XII are intact. No focal Motor or sensory deficits b/l.  PSYCHIATRIC: The patient is alert and oriented x 3.  SKIN: No obvious rash, lesion, or ulcer.    LABORATORY PANEL:   CBC Recent Labs  Lab 10/11/17 1228  WBC 6.6  HGB 12.3  HCT 35.6  PLT 215   ------------------------------------------------------------------------------------------------------------------  Chemistries  Recent Labs  Lab 10/11/17 1058  10/13/17 0523  NA 138   < > 137  K 5.7*   < > 5.0  CL 113*   < > 114*  CO2 16*   < > 20*  GLUCOSE 162*   < > 99  BUN 54*   < > 32*  CREATININE 2.15*   < > 1.52*  CALCIUM 9.1   < > 8.7*  AST 21  --   --   ALT 14  --   --   ALKPHOS 73  --   --   BILITOT 0.8  --   --    < > = values in this interval not displayed.   ------------------------------------------------------------------------------------------------------------------  Cardiac Enzymes No results for input(s): TROPONINI in the last 168 hours. ------------------------------------------------------------------------------------------------------------------  RADIOLOGY:  US Renal  Result Date: 10/12/2017 CLINICAL DATA:  Acute kidney injury EXAM: RENAL / URINARY TRACT ULTRASOUND COMPLETE COMPARISON:  None. FINDINGS: Right Kidney: Length: 8.9 cm. Renal cortical thinning. Increased renal cortical echogenicity. No mass or hydronephrosis visualized. Left Kidney: Length: 10.3 cm. Renal cortical thinning. Increased renal cortical echogenicity. No mass or hydronephrosis visualized. Bladder: Appears normal for degree of bladder distention. IMPRESSION: Increased renal cortical echogenicity with renal cortical thinning as  can be seen with medical renal disease. Electronically Signed   By: Kathreen Devoid   On: 10/12/2017 10:10     ASSESSMENT AND PLAN:   77 year old female with past medical history of hypertension, gout, asthma, osteoarthritis,  previous history of a diverticulitis with abscess status post partial colectomy with colostomy who presented to the hospital due to abnormal blood work and noted to be hyperkalemic.  1.  Hyperkalemia- secondary to acute on chronic kidney injury combined with patient being on ace inhibitors. - Improved with Veltassa, potassium level improved at 5.0 today.  2.  Acute on chronic renal failure- patient's baseline creatinine is 1.5-1.7.  Patient presented to the hospital with a creatinine of 2.1.  Continue IV fluids and creatinine normalized now.  Continue to hold ACE inhibitor for now and will resume in next 1-2 days. - Renal Ultrasound showing no obstruction or hydronephrosis.  3.  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  Transferred to the surgical service.  Continue further care as per surgery.  4.  Diabetes type 2 without complication-continue sliding scale insulin.  5.  Gout-no acute attack.  Continue allopurinol.   All the records are reviewed and case discussed with Care Management/Social Worker. Management plans discussed with the patient, family and they are in agreement.  CODE STATUS: Full code  DVT Prophylaxis: Heparin subcu  TOTAL TIME TAKING CARE OF THIS PATIENT: 25 minutes.   POSSIBLE D/C depends on surgery and pt's progress.   Henreitta Leber M.D on 10/13/2017 at 3:20 PM  Between 7am to 6pm - Pager - [phone]  After 6pm go to [email or web address] - Proofreader  [facility]  Office  [phone]  CC: Primary care physician; Derinda Late, MD

## 2017-10-13 NOTE — Anesthesia Post-op Follow-up Note (Signed)
Anesthesia QCDR form completed.        

## 2017-10-13 NOTE — Progress Notes (Signed)
Notified Dr. Peyton Najjar. Asking a question about giving Pt heparin sq this am. At this time holding heparin to ask Dr. [name]. Will continue to monitor and assess.

## 2017-10-13 NOTE — Progress Notes (Signed)
Pt scheduled 1000 mg of tylenol x 1 to be given on call to the OR. This RN dropped 1 of the 500 mg tablets onto the floor. Called Pharmacy, Per Shanon Brow, was told to order 1 500 mg tablet.

## 2017-10-13 NOTE — Plan of Care (Signed)
  Problem: Clinical Measurements: Goal: Will remain free from infection Outcome: Progressing Goal: Diagnostic test results will improve Outcome: Progressing Goal: Cardiovascular complication will be avoided Outcome: Progressing   

## 2017-10-13 NOTE — Op Note (Signed)
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 required (icd-10's: Z93.2)  PROCEDURE(S):  1.) Reversal of diverting loop ileostomy (cpt: 47096)  INTRAOPERATIVE FINDINGS: Patent, viable, and well-perfused ileum with widely patent and well-perfused ileal anastomosis without tension   INTRAVENOUS FLUIDS: 1000 mL crystalloid   ESTIMATED BLOOD LOSS: 20 mL   URINE OUTPUT: 500 mL   SPECIMENS: No Specimen  IMPLANTS: None  DRAINS: none  COMPLICATIONS: None apparent  CONDITION AT END OF PROCEDURE: Hemodynamically stable and extubated  DISPOSITION OF PATIENT: PACU  INDICATIONS FOR PROCEDURE:  Patient is a 77 year old Female who presented to [facility] ED 3 months ago for worsening LLQ abdominal pain with impaired appetite x 2 months and significant weight loss. Considering her brother died from colon cancer at 8 years old and she'd never underwent a screening colonoscopy, patient was started on TPN and underwent partial colectomy with diverting loop ileostomy considering viable healthy bowel without contamination in context of severe malnutrition. Patient recovered well, pathology did not show malignancy, and her appetite and nutrition and improved substantially. She underwent uneventful colonoscopy, all risks/benefits and alternative to reversal of diverting loop ileostomy were discussed, informed consent was obtained and documented, and reversal of diverting loop ileostomy was scheduled. Pre-operative labs demonstrated AKI superimposed on CKD with hyperkalemia, for which patient was admitted to [facility] 2 days prior to scheduled surgery, and patient was hydrated with improved potassium and Cr. Patient was then discussed with hospitalist and anesthesiologist, and decision  was made to proceed as scheduled.  DETAILS OF PROCEDURE: Patient was brought to the operating suite and appropriately identified. General anesthesia was administered along with appropriate pre-operative antibiotics, and endotracheal intubation was performed by anesthetist. In supine position, operative site was prepped and draped in the usual sterile fashion with betadine for the ileostomy, and a gauze sponge was placed over the ileostomy while the rest of the abdomen was prepped and draped. Following a brief time out, an elliptical skin incision was made about 2 mm from the mucocutaneous junction around the ileostomy. The incision was then extended deep through subcutaneous tissues towards fascia, taking care to avoid injury to the ileum and small bowel mesentery associated with the diverting loop ileostomy. Corresponding adhesions were likewise carefully lysed, and ileum was freed circumferentially from the surrounding fascia. GIA-55 and reload were advanced across each limb of the loop ileostomy, and the mucocutaneous junction and rim of skin were excised and handed off the field. Two 3-0 silk stay sutures were placed to approximate antimesenteric bowel prior to anastomosis, enterotomies were made in adjacent to the stapled ends of bowel, and GIA-55 reload was advanced, compressed, and deployed. Common channel of ileum was confirmed to be hemostatic and widely patent, and one more GIA-55 reload was used to close the resulting common enterotomy.  Closed, patent, and hemostatic anastomosis was reduced into the abdominal cavity without difficulty. Non-looped #1 PDS suture was used to re-approximate fascia, taking care to avoid injury to underlying viscera. Wound was then copiously irrigated with 1 Liter of saline and suctioned/dried. Buried interrupted 3-0 Vicryl suture was used to widely re-approximated dermis, and widely spaced surgical skin staples were used to re-approximate epidermis. Surrounding skin was  cleaned and dried, and occlusive adhesive dressing was applied.  Patient was then safely able to be extubated, awakened, and transferred to PACU  for post-operative monitoring and care.  I was present for all aspects of the above procedure, and no operative complications were apparent.

## 2017-10-13 NOTE — Anesthesia Preprocedure Evaluation (Signed)
Anesthesia Evaluation  Patient identified by MRN, date of birth, ID band Patient awake    Reviewed: Allergy & Precautions, H&P , NPO status , reviewed documented beta blocker date and time   Airway Mallampati: II  TM Distance: >3 FB Neck ROM: full    Dental  (+) Chipped, Missing, Poor Dentition   Pulmonary former smoker,    Pulmonary exam normal        Cardiovascular hypertension, Normal cardiovascular exam     Neuro/Psych    GI/Hepatic   Endo/Other  diabetes  Renal/GU      Musculoskeletal  (+) Arthritis ,   Abdominal   Peds  Hematology   Anesthesia Other Findings Past Medical History: No date: Arthritis     Comment:  knees, right foot No date: Asthma     Comment:  as child No date: Diabetes mellitus without complication (Hydro) 06/20/4008: Diverticulitis of large intestine with perforation and  abscess without bleeding No date: Family history of adverse reaction to anesthesia     Comment:  daughter had adverse reaction to anesthesia after taking              a blood pressure medication starting with "M" No date: Gout No date: Hypertension Past Surgical History: No date: ABDOMINAL HYSTERECTOMY 02/25/2016: CATARACT EXTRACTION W/PHACO; Left     Comment:  Procedure: CATARACT EXTRACTION PHACO AND INTRAOCULAR               LENS PLACEMENT (IOC);  Surgeon: Eulogio Bear, MD;                Location: [place];  Service: Ophthalmology;                Laterality: Left;  DIABETIC - oral meds LEFT 04/07/2016: CATARACT EXTRACTION W/PHACO; Right     Comment:  Procedure: CATARACT EXTRACTION PHACO AND INTRAOCULAR               LENS PLACEMENT (IOC);  Surgeon: Eulogio Bear, MD;                Location: [place];  Service: Ophthalmology;                Laterality: Right;  RIGHT DIABETES - oral meds IVA               TOPICAL 07/18/2017: COLECTOMY WITH COLOSTOMY CREATION/HARTMANN PROCEDURE; Left     Comment:  Procedure: COLECTOMY WITH loop ileostomy;  Surgeon:               Vickie Epley, MD;  Location: [facility];  Service:               General;  Laterality: Left; 09/20/2017: COLONOSCOPY WITH PROPOFOL; N/A     Comment:  Procedure: COLONOSCOPY WITH PROPOFOL;  Surgeon:               Virgel Manifold, MD;  Location: [facility];                Service: Endoscopy;  Laterality: N/A; No date: KNEE SURGERY; Left BMI    Body Mass Index:  32.73 kg/m     Reproductive/Obstetrics                             Anesthesia Physical Anesthesia Plan  ASA: III  Anesthesia Plan: General ETT   Post-op Pain Management:    Induction:   PONV Risk Score  and Plan: 4 or greater and Ondansetron, Treatment may vary due to age or medical condition, Midazolam and Dexamethasone  Airway Management Planned:   Additional Equipment:   Intra-op Plan:   Post-operative Plan:   Informed Consent: I have reviewed the patients History and Physical, chart, labs and discussed the procedure including the risks, benefits and alternatives for the proposed anesthesia with the patient or authorized representative who has indicated his/her understanding and acceptance.   Dental Advisory Given  Plan Discussed with: CRNA  Anesthesia Plan Comments:         Anesthesia Quick Evaluation

## 2017-10-13 NOTE — Interval H&P Note (Signed)
History and Physical Interval Note:  10/13/2017 6:54 AM  Brenda Gregory  has presented today for surgery, with the diagnosis of diverticulitis  The various methods of treatment have been discussed with the patient and family. After consideration of risks, benefits and other options for treatment, the patient has consented to  Procedure(s): ILEOSTOMY TAKEDOWN (N/A) as a surgical intervention .  The patient's history has been reviewed, patient examined, no change in status, stable for surgery.  I have reviewed the patient's chart and labs.  Questions were answered to the patient's satisfaction.     Vickie Epley

## 2017-10-13 NOTE — Anesthesia Procedure Notes (Signed)
Procedure Name: Intubation Date/Time: 10/13/2017 7:59 AM Performed by: Wess Botts, RN Pre-anesthesia Checklist: Patient identified, Emergency Drugs available, Suction available, Patient being monitored and Timeout performed Patient Re-evaluated:Patient Re-evaluated prior to induction Oxygen Delivery Method: Circle system utilized Preoxygenation: Pre-oxygenation with 100% oxygen Induction Type: IV induction Ventilation: Mask ventilation without difficulty Laryngoscope Size: Mac and 3 Grade View: Grade I Tube size: 7.5 mm Number of attempts: 1 Airway Equipment and Method: Stylet Placement Confirmation: ETT inserted through vocal cords under direct vision,  positive ETCO2 and breath sounds checked- equal and bilateral Secured at: 21 cm Tube secured with: Tape Dental Injury: Teeth and Oropharynx as per pre-operative assessment

## 2017-10-13 NOTE — Telephone Encounter (Signed)
I have spoke with Janett Billow with [facility]. She was advised that patient was seen in the ED at [facility] on 10/12/17 and was admitted. Her insurance notification was changed to concurrent from scheduled admission. No documents are needing to be faxed at this time until patient is discharged. The UR department will handle the insurance needs due to admission.

## 2017-10-13 NOTE — Anesthesia Postprocedure Evaluation (Signed)
Anesthesia Post Note  Patient: Brenda Gregory  Procedure(s) Performed: ILEOSTOMY TAKEDOWN (N/A )  Patient location during evaluation: PACU Anesthesia Type: General Level of consciousness: awake and alert Pain management: pain level controlled Vital Signs Assessment: post-procedure vital signs reviewed and stable Respiratory status: spontaneous breathing, nonlabored ventilation and respiratory function stable Cardiovascular status: blood pressure returned to baseline and stable Postop Assessment: no apparent nausea or vomiting Anesthetic complications: no     Last Vitals:  Vitals:   10/13/17 1151 10/13/17 1224  BP:  105/60  Pulse: 60 64  Resp: (!) 8 18  Temp:    SpO2: 97% 96%    Last Pain:  Vitals:   10/13/17 1318  TempSrc:   PainSc: 7                  Jeffry Vogelsang T Lavone Neri

## 2017-10-14 LAB — BASIC METABOLIC PANEL
Anion gap: 7 (ref 5–15)
BUN: 29 mg/dL — AB (ref 8–23)
CHLORIDE: 113 mmol/L — AB (ref 98–111)
CO2: 17 mmol/L — ABNORMAL LOW (ref 22–32)
CREATININE: 1.66 mg/dL — AB (ref 0.44–1.00)
Calcium: 8.5 mg/dL — ABNORMAL LOW (ref 8.9–10.3)
GFR calc Af Amer: 34 mL/min — ABNORMAL LOW (ref >60)
GFR calc non Af Amer: 29 mL/min — ABNORMAL LOW (ref >60)
GLUCOSE: 163 mg/dL — AB (ref 70–99)
POTASSIUM: 4.7 mmol/L (ref 3.5–5.1)
SODIUM: 137 mmol/L (ref 135–145)

## 2017-10-14 LAB — CBC
HCT: 33.8 % — ABNORMAL LOW (ref 35.0–47.0)
Hemoglobin: 11.2 g/dL — ABNORMAL LOW (ref 12.0–16.0)
MCH: 30.5 pg (ref 26.0–34.0)
MCHC: 33.2 g/dL (ref 32.0–36.0)
MCV: 91.9 fL (ref 80.0–100.0)
Platelets: 175 10*3/uL (ref 150–440)
RBC: 3.68 MIL/uL — ABNORMAL LOW (ref 3.80–5.20)
RDW: 17.6 % — AB (ref 11.5–14.5)
WBC: 9.4 10*3/uL (ref 3.6–11.0)

## 2017-10-14 LAB — GLUCOSE, CAPILLARY
Glucose-Capillary: 105 mg/dL — ABNORMAL HIGH (ref 70–99)
Glucose-Capillary: 141 mg/dL — ABNORMAL HIGH (ref 70–99)
Glucose-Capillary: 95 mg/dL (ref 70–99)
Glucose-Capillary: 119 mg/dL — ABNORMAL HIGH (ref 70–99)

## 2017-10-14 NOTE — Progress Notes (Signed)
[facility] [place] NAME: Brenda Gregory    MR#:  [id number]  DATE OF BIRTH:  May 21, 1940  SUBJECTIVE:   Doing well. States she is very happy to have her colostomy reversed. Very minimal abdominal pain. No nausea, no vomiting, taking good po.  REVIEW OF SYSTEMS:    Review of Systems  Constitutional: Negative for chills and fever.  HENT: Negative for congestion and tinnitus.   Eyes: Negative for blurred vision and double vision.  Respiratory: Negative for cough, shortness of breath and wheezing.   Cardiovascular: Negative for chest pain, orthopnea and PND.  Gastrointestinal: Negative for abdominal pain, diarrhea, nausea and vomiting.  Genitourinary: Negative for dysuria and hematuria.  Neurological: Negative for dizziness, sensory change and focal weakness.  All other systems reviewed and are negative.   Nutrition: soft diet Tolerating Diet: Yes Tolerating PT: Await Eval.   DRUG ALLERGIES:  No Known Allergies  VITALS:  Blood pressure (!) 131/59, pulse 68, temperature 98 F (36.7 C), temperature source Oral, resp. rate 18, height 5\' 5"  (1.651 m), weight 91.2 kg (201 lb 1.6 oz), SpO2 100 %.  PHYSICAL EXAMINATION:   Physical Exam  GENERAL:  77 y.o.-year-old patient lying in bed, in NAD EYES: PERRLA, EOMI, no scleral icterus HEENT: Head atraumatic, normocephalic. Oropharynx and nasopharynx clear.  NECK:  Supple, no jugular venous distention. No thyroid enlargement, no tenderness.  LUNGS: Normal breath sounds bilaterally, no wheezing, rales, rhonchi. No use of accessory muscles of respiration.  CARDIOVASCULAR: S1, S2 normal. No murmurs, rubs, or gallops.  ABDOMEN: Soft, nontender, nondistended. Bowel sounds present. No organomegaly or mass. Dry dressing on right side of abdomen.  EXTREMITIES: No cyanosis, clubbing or edema b/l.    NEUROLOGIC: Cranial nerves II through XII are intact. No focal motor or sensory deficits b/l.   PSYCHIATRIC: The patient is alert and oriented x 3, appropriate affect. SKIN: No obvious rash, lesion, or ulcer.    LABORATORY PANEL:   CBC Recent Labs  Lab 10/14/17 0416  WBC 9.4  HGB 11.2*  HCT 33.8*  PLT 175   ------------------------------------------------------------------------------------------------------------------  Chemistries  Recent Labs  Lab 10/11/17 1058  10/14/17 0416  NA 138   < > 137  K 5.7*   < > 4.7  CL 113*   < > 113*  CO2 16*   < > 17*  GLUCOSE 162*   < > 163*  BUN 54*   < > 29*  CREATININE 2.15*   < > 1.66*  CALCIUM 9.1   < > 8.5*  AST 21  --   --   ALT 14  --   --   ALKPHOS 73  --   --   BILITOT 0.8  --   --    < > = values in this interval not displayed.   ------------------------------------------------------------------------------------------------------------------  Cardiac Enzymes No results for input(s): TROPONINI in the last 168 hours. ------------------------------------------------------------------------------------------------------------------  RADIOLOGY:  No results found.   ASSESSMENT AND PLAN:   77 year old female with past medical history of hypertension, gout, asthma, osteoarthritis, previous history of a diverticulitis with abscess status post partial colectomy with colostomy who presented to the hospital due to abnormal blood work and noted to be hyperkalemic. She underwent colostomy reversal on 7/17 and was subsequently transferred to the surgical service.  1.  Hyperkalemia- resolved. secondary to acute on chronic kidney injury combined with patient being on ace inhibitors. - s/p veltassa and kayexalate - K 4.7 today  2.  Acute on chronic renal failure- Cr now at baseline of 1.5-1.7. Initial creatinine was 2.1 on admission. patient's baseline creatinine is 1.5-1.7.  Patient presented to the hospital with a creatinine of 2.1.  - would recommend holding off on restarting ACE, as BPs have been well-controlled. Can  hold on discharge and have PCP restart ACE as an outpatient. - Renal Ultrasound showing no obstruction or hydronephrosis.  3.  History of a diverticular abscess status post partial colectomy and colostomy placement- she is status post reversal of her colostomy 7/17. - care per surgical service  4.  Diabetes type 2 without complication-continue sliding scale insulin.  5.  Gout-no acute attack.  Continue allopurinol.   All the records are reviewed and case discussed with Care Management/Social Worker. Management plans discussed with the patient, family and they are in agreement.  CODE STATUS: Full code  DVT Prophylaxis: Heparin   TOTAL TIME TAKING CARE OF THIS PATIENT: 25 minutes.   POSSIBLE D/C depends on surgery.  Berna Spare Mayo M.D on 10/14/2017 at 6:41 PM  Between 7am to 6pm - Pager - [phone]  After 6pm go to [email or web address] - Proofreader  [facility]  Office  [phone]  CC: Primary care physician; Derinda Late, MD

## 2017-10-14 NOTE — Progress Notes (Signed)
Prior note for patient's daughter written at request of Dr. [name].

## 2017-10-14 NOTE — Care Management Important Message (Signed)
Copy of signed IM left with patient in room.  

## 2017-10-14 NOTE — Progress Notes (Signed)
[facility] Hospital Day(s): 3.   Post op day(s): 1 Day Post-Op.   Interval History: Patient seen and examined, no acute events or new complaints overnight. Patient reports +flatus since yesterday evening, tolerating clear liquids diet, mild peri-incisional pain well-controlled with Tylenol alone, ambulated x1, denies N/V, fever/chills, CP, or SOB.  Review of Systems:  Constitutional: denies fever, chills  Respiratory: denies any shortness of breath  Cardiovascular: denies chest pain or palpitations  Gastrointestinal: abdominal pain, N/V, and bowel function as per interval history Musculoskeletal: denies pain, decreased motor or sensation Integumentary: denies any other rashes or skin discolorations except post-surgical abdominal wound  Vital signs in last 24 hours: [min-max] current  Temp:  [97.5 F (36.4 C)-98.2 F (36.8 C)] 97.7 F (36.5 C) (07/18 0802) Pulse Rate:  [63-65] 63 (07/18 0802) Resp:  [18] 18 (07/18 0358) BP: (120-126)/(55-68) 126/68 (07/18 0802) SpO2:  [99 %-100 %] 100 % (07/18 0802) Weight:  [201 lb 1.6 oz (91.2 kg)] 201 lb 1.6 oz (91.2 kg) (07/18 0358)     Height: 5\' 5"  (165.1 cm) Weight: 201 lb 1.6 oz (91.2 kg) BMI (Calculated): 33.46   Intake/Output this shift:  Total I/O In: 120 [P.O.:120] Out: -    Intake/Output last 2 shifts:  @IOLAST2SHIFTS @   Physical Exam:  Constitutional: alert, cooperative and no distress  Respiratory: breathing non-labored at rest  Cardiovascular: regular rate and sinus rhythm  Gastrointestinal: soft and non-distended with minimal peri-incisional tenderness to palpation, incision well-approximated without surrounding erythema or drainage, dressing c/d/i  Labs:  CBC Latest Ref Rng & Units 10/14/2017 10/11/2017 10/08/2017  WBC 3.6 - 11.0 K/uL 9.4 6.6 6.4  Hemoglobin 12.0 - 16.0 g/dL 11.2(L) 12.3 12.2  Hematocrit 35.0 - 47.0 % 33.8(L) 35.6 37.5  Platelets 150 - 440 K/uL 175 215 216   CMP Latest Ref Rng & Units  10/14/2017 10/13/2017 10/12/2017  Glucose 70 - 99 mg/dL 163(H) 99 94  BUN 8 - 23 mg/dL 29(H) 32(H) 47(H)  Creatinine 0.44 - 1.00 mg/dL 1.66(H) 1.52(H) 1.83(H)  Sodium 135 - 145 mmol/L 137 137 138  Potassium 3.5 - 5.1 mmol/L 4.7 5.0 5.4(H)  Chloride 98 - 111 mmol/L 113(H) 114(H) 115(H)  CO2 22 - 32 mmol/L 17(L) 20(L) 18(L)  Calcium 8.9 - 10.3 mg/dL 8.5(L) 8.7(L) 9.1  Total Protein 6.5 - 8.1 g/dL - - -  Total Bilirubin 0.3 - 1.2 mg/dL - - -  Alkaline Phos 38 - 126 U/L - - -  AST 15 - 41 U/L - - -  ALT 0 - 44 U/L - - -   Imaging studies: No new pertinent imaging studies   Assessment/Plan: (ICD-10's: Z93.2) 77 y.o. female doing well 1 Day Post-Op s/p reversal of diverting loop ileostomy s/p emergent partial colectomy for perforated sigmoid diverticulitis with large LLQ abdominal wall abscess, complicated by pre-surgical admission for AKI with hyperkalemia and by pertinent comorbidities including obesity (BMI >33), DM, HTN, CKD, gout, osteoarthritis, and former tobacco abuse (smoking).   - advanced to full liquids  - pain control as needed (has only been taking Tylenol)  - if tolerates full liquids with ongoing flatus, may advance to soft diet for dinner  - continue IV fluids as ordered for hyperkalemia for which patient was admittedly pre-operatively  - DVT prophylaxis with continued ambulation encouraged  - recheck/trend BMP (Cr, K) tomorrow morning  - possible discharge home tomorrow  All of the above findings and recommendations were discussed with the patient, patient's daughter, medical physician, and patient's  RN, and all of patient's and family's questions were answered to their expressed satisfaction.  -- Marilynne Drivers [name], MD, [place]: [facility] - Partnering for exceptional care. Office: [phone]

## 2017-10-14 NOTE — IPOC Note (Signed)
  October 14, 2017  Patient: Brenda Gregory  Date of Birth: [birth year]  Date of Visit: 10/11/2017    To Whom It May Concern:  Daven Pinckney, mother of Sherrye Payor, has been hospitalized since7/15/2019, under the care of Dr. Thea Alken.   Sincerely,   Lennart Pall, RN

## 2017-10-15 LAB — BASIC METABOLIC PANEL
Anion gap: 4 — ABNORMAL LOW (ref 5–15)
BUN: 26 mg/dL — ABNORMAL HIGH (ref 8–23)
CHLORIDE: 118 mmol/L — AB (ref 98–111)
CO2: 20 mmol/L — ABNORMAL LOW (ref 22–32)
Calcium: 8.1 mg/dL — ABNORMAL LOW (ref 8.9–10.3)
Creatinine, Ser: 1.36 mg/dL — ABNORMAL HIGH (ref 0.44–1.00)
GFR calc non Af Amer: 37 mL/min — ABNORMAL LOW (ref >60)
GFR, EST AFRICAN AMERICAN: 43 mL/min — AB (ref >60)
Glucose, Bld: 104 mg/dL — ABNORMAL HIGH (ref 70–99)
POTASSIUM: 4.6 mmol/L (ref 3.5–5.1)
SODIUM: 142 mmol/L (ref 135–145)

## 2017-10-15 LAB — CBC
HCT: 30.6 % — ABNORMAL LOW (ref 35.0–47.0)
HEMOGLOBIN: 10.3 g/dL — AB (ref 12.0–16.0)
MCH: 30.9 pg (ref 26.0–34.0)
MCHC: 33.8 g/dL (ref 32.0–36.0)
MCV: 91.6 fL (ref 80.0–100.0)
Platelets: 146 10*3/uL — ABNORMAL LOW (ref 150–440)
RBC: 3.34 MIL/uL — AB (ref 3.80–5.20)
RDW: 17.7 % — ABNORMAL HIGH (ref 11.5–14.5)
WBC: 6.9 10*3/uL (ref 3.6–11.0)

## 2017-10-15 LAB — GLUCOSE, CAPILLARY
GLUCOSE-CAPILLARY: 91 mg/dL (ref 70–99)
GLUCOSE-CAPILLARY: 126 mg/dL — AB (ref 70–99)
GLUCOSE-CAPILLARY: 110 mg/dL — AB (ref 70–99)
Glucose-Capillary: 122 mg/dL — ABNORMAL HIGH (ref 70–99)

## 2017-10-15 MED ORDER — OXYCODONE HCL 5 MG PO TABS: 5.0000 mg | ORAL_TABLET | Freq: Four times a day (QID) | ORAL | 0 refills | Status: DC | PRN

## 2017-10-15 NOTE — Progress Notes (Signed)
10/15/2017  Subjective: Patient is 2 Days Post-Op s/p ileostomy takedown by Dr. [name].  Patient is doing well and only reports some soreness at the incision site.  She's on a soft diet and tolerating well. Denies any nausea or vomiting and is having flatus.  Vital signs: Temp:  [98 F (36.7 C)-98.3 F (36.8 C)] 98.3 F (36.8 C) (07/19 0443) Pulse Rate:  [68-71] 68 (07/19 0443) Resp:  [18] 18 (07/19 0443) BP: (131-135)/(59-68) 132/68 (07/19 0443) SpO2:  [100 %] 100 % (07/19 0443) Weight:  [203 lb 4.8 oz (92.2 kg)] 203 lb 4.8 oz (92.2 kg) (07/19 0442)   Intake/Output: 07/18 0701 - 07/19 0700 In: 120 [P.O.:120] Out: 500 [Urine:500] Last BM Date: 10/11/17  Physical Exam: Constitutional: No acute distress Abdomen:  Soft, non-distended, appropriately tender to palpation.  Incision is clean, dry, intact with staples and dressing in place.  Labs:  Recent Labs    10/14/17 0416 10/15/17 0414  WBC 9.4 6.9  HGB 11.2* 10.3*  HCT 33.8* 30.6*  PLT 175 146*   Recent Labs    10/14/17 0416 10/15/17 0414  NA 137 142  K 4.7 4.6  CL 113* 118*  CO2 17* 20*  GLUCOSE 163* 104*  BUN 29* 26*  CREATININE 1.66* 1.36*  CALCIUM 8.5* 8.1*   No results for input(s): LABPROT, INR in the last 72 hours.  Imaging: No results found.  Assessment/Plan: 77 yo female s/p ileostomy takedown.  --continue soft diet today and ambulation --DVT prophylaxis --Patient and her daughter prefer the patient be discharged tomorrow so the patient does not spend the day alone today at home.  Will plan for discharge tomorrow instead.   Melvyn Neth, [facility]

## 2017-10-15 NOTE — Plan of Care (Signed)

## 2017-10-15 NOTE — Plan of Care (Signed)
Stable while ambulating

## 2017-10-15 NOTE — Discharge Summary (Signed)
Patient ID: BRITLYN MARTINE MRN: [id number] DOB/AGE: 75-Jul-1942 75 y.o.  Admit date: 10/11/2017 Discharge date: 10/16/2017   Discharge Diagnoses:  Active Problems:   Hyperkalemia   Procedures:  Ileostomy takedown  Hospital Course:  Patient was admitted on 7/15 due to hyperkalemia and acute on chronic renal failure.  This was thought to be due to dehydration from her ileostomy.  She was admitted to the medical team and had IV fluid hydration and Kayexalate for her potassium.  This normalized and her renal function improved as well.  She then was taken to the OR on 7/17 for ileostomy takedown, which she tolerated well.  Her diet was advanced, and she was tolerating with appropriate bowel function.  She was ambulating, and her pain was well controlled.  She was deemed ready for discharge.  On exam, she's in no acute distress, with stable vital signs.  Her incision is clean, dry, intact with staples in place.  Consults:  Hospitalist  Disposition:  Home, self-care  Discharge Instructions    Call MD for:  difficulty breathing, headache or visual disturbances   Complete by:  As directed    Call MD for:  persistant nausea and vomiting   Complete by:  As directed    Call MD for:  redness, tenderness, or signs of infection (pain, swelling, redness, odor or green/yellow discharge around incision site)   Complete by:  As directed    Call MD for:  severe uncontrolled pain   Complete by:  As directed    Call MD for:  temperature >100.4   Complete by:  As directed    Change dressing (specify)   Complete by:  As directed    May place a dry gauze dressing secured with tape over the incision as needed for any drainage.  Otherwise the incision can be left open to air.   Diet - low sodium heart healthy   Complete by:  As directed    Discharge instructions   Complete by:  As directed    1.  Patient may shower, but do not scrub wound heavily and dab dry only. 2.  Do not submerge wounds in  pool/tub 3.  Do not remove staples.   Driving Restrictions   Complete by:  As directed    Do not drive while taking narcotics for pain control.   Increase activity slowly   Complete by:  As directed    Lifting restrictions   Complete by:  As directed    No heavy lifting or pushing of more than 10-15 lbs for 4 weeks.     Allergies as of 10/15/2017   No Known Allergies     Medication List    STOP taking these medications   lisinopril 20 MG tablet Commonly known as:  PRINIVIL,ZESTRIL     TAKE these medications   acetaminophen 650 MG CR tablet Commonly known as:  TYLENOL Take 650 mg by mouth every 8 (eight) hours as needed for pain.   allopurinol 300 MG tablet Commonly known as:  ZYLOPRIM Take 300 mg by mouth daily.   metFORMIN 500 MG tablet Commonly known as:  GLUCOPHAGE Take 500 mg by mouth 2 (two) times daily with a meal.   oxyCODONE 5 MG immediate release tablet Commonly known as:  Oxy IR/ROXICODONE Take 1 tablet (5 mg total) by mouth every 6 (six) hours as needed for moderate pain or severe pain.            Discharge Care Instructions  (From  admission, onward)        Start     Ordered   10/15/17 0000  Change dressing (specify)    Comments:  May place a dry gauze dressing secured with tape over the incision as needed for any drainage.  Otherwise the incision can be left open to air.   10/15/17 1251     Follow-up Information    Derinda Late, MD Follow up in 1 week(s).   Specialty:  Family Medicine Why:  Please follow up with your PCP at your earliest convenience to help manage your blood pressure.   Contact information: [address] [facility] [place] [address] [phone]        Vickie Epley, MD. Schedule an appointment as soon as possible for a visit in 1 week(s).   Specialty:  General Surgery Why:  Follow up with Dr. Dahlia Byes (Dr. [name]' partner) on 7/24 for staple removal. Contact  information: [place] [address] [phone]

## 2017-10-16 LAB — GLUCOSE, CAPILLARY: Glucose-Capillary: 94 mg/dL (ref 70–99)

## 2017-10-16 NOTE — Final Progress Note (Signed)
[facility] Hospital Day(s): 5.   Post op day(s): 3 Days Post-Op.   Interval History: Patient seen and examined, no acute events or new complaints overnight. Patient reports feeling well. Refers ate eggs for breakfast and tolerated. Refers passing flatus and had bowel movement. Pain controlled.    Vital signs in last 24 hours: [min-max] current  Temp:  [97.9 F (36.6 C)-98.4 F (36.9 C)] 97.9 F (36.6 C) (07/20 0818) Pulse Rate:  [66-67] 66 (07/20 0818) Resp:  [14-18] 14 (07/20 0818) BP: (116-135)/(63-71) 132/71 (07/20 0818) SpO2:  [96 %-100 %] 100 % (07/20 0818) Weight:  [91.8 kg (202 lb 6.4 oz)] 91.8 kg (202 lb 6.4 oz) (07/20 0100)     Height: 5\' 5"  (165.1 cm) Weight: 91.8 kg (202 lb 6.4 oz) BMI (Calculated): 33.68    Physical Exam:  Constitutional: alert, cooperative and no distress  Respiratory: breathing non-labored at rest  Cardiovascular: regular rate and sinus rhythm  Gastrointestinal: soft, non-tender, and non-distended. Wound dry and clean.   Labs:  CBC Latest Ref Rng & Units 10/15/2017 10/14/2017 10/11/2017  WBC 3.6 - 11.0 K/uL 6.9 9.4 6.6  Hemoglobin 12.0 - 16.0 g/dL 10.3(L) 11.2(L) 12.3  Hematocrit 35.0 - 47.0 % 30.6(L) 33.8(L) 35.6  Platelets 150 - 440 K/uL 146(L) 175 215   CMP Latest Ref Rng & Units 10/15/2017 10/14/2017 10/13/2017  Glucose 70 - 99 mg/dL 104(H) 163(H) 99  BUN 8 - 23 mg/dL 26(H) 29(H) 32(H)  Creatinine 0.44 - 1.00 mg/dL 1.36(H) 1.66(H) 1.52(H)  Sodium 135 - 145 mmol/L 142 137 137  Potassium 3.5 - 5.1 mmol/L 4.6 4.7 5.0  Chloride 98 - 111 mmol/L 118(H) 113(H) 114(H)  CO2 22 - 32 mmol/L 20(L) 17(L) 20(L)  Calcium 8.9 - 10.3 mg/dL 8.1(L) 8.5(L) 8.7(L)  Total Protein 6.5 - 8.1 g/dL - - -  Total Bilirubin 0.3 - 1.2 mg/dL - - -  Alkaline Phos 38 - 126 U/L - - -  AST 15 - 41 U/L - - -  ALT 0 - 44 U/L - - -     Imaging studies: No new pertinent imaging studies   Assessment/Plan:  77 y.o. female 3 Days Post-Op s/p ileostomy  reversal. Doing well today. Pain controlled. Tolerating regular diet, passing flatus and bowel movement. Ambulating and voiding spontaneously. NO significant electrolyte disturbance. Stable hemoglobin. Will discharge home today and will be followed with [facility] for post op visit.   Arnold Long, MD

## 2017-10-16 NOTE — Plan of Care (Signed)
Patient has faint hypoactive bowel sounds, but positive bowel movement. Some gas. Patient is tolerating oral intake.

## 2017-10-16 NOTE — Progress Notes (Addendum)
Brenda Gregory to be D/C'd Home per MD order.  Discussed prescriptions and follow up appointments with the patient. Prescriptions were eprescribed, medication list explained in detail. Pt verbalized understanding. Daughter at bedside.  Allergies as of 10/16/2017   No Known Allergies     Medication List    STOP taking these medications   lisinopril 20 MG tablet Commonly known as:  PRINIVIL,ZESTRIL     TAKE these medications   acetaminophen 650 MG CR tablet Commonly known as:  TYLENOL Take 650 mg by mouth every 8 (eight) hours as needed for pain.   allopurinol 300 MG tablet Commonly known as:  ZYLOPRIM Take 300 mg by mouth daily.   metFORMIN 500 MG tablet Commonly known as:  GLUCOPHAGE Take 500 mg by mouth 2 (two) times daily with a meal.   oxyCODONE 5 MG immediate release tablet Commonly known as:  Oxy IR/ROXICODONE Take 1 tablet (5 mg total) by mouth every 6 (six) hours as needed for moderate pain or severe pain.            Discharge Care Instructions  (From admission, onward)        Start     Ordered   10/15/17 0000  Change dressing (specify)    Comments:  May place a dry gauze dressing secured with tape over the incision as needed for any drainage.  Otherwise the incision can be left open to air.   10/15/17 1251      Vitals:   10/16/17 0313 10/16/17 0818  BP: 135/70 132/71  Pulse: 66 66  Resp: 18 14  Temp: 97.9 F (36.6 C) 97.9 F (36.6 C)  SpO2: 100% 100%    Tele box removed and returned. Skin clean, dry and intact without evidence of skin break down, no evidence of skin tears noted. IV catheter discontinued intact. Site without signs and symptoms of complications. Dressing and pressure applied. Pt denies pain at this time. No complaints noted.  An After Visit Summary was printed and given to the patient. Patient escorted via [place], and D/C home via private auto.  Rolley Sims

## 2017-10-17 DIAGNOSIS — Z9049 Acquired absence of other specified parts of digestive tract: Secondary | ICD-10-CM | POA: Diagnosis not present

## 2017-10-17 DIAGNOSIS — Z932 Ileostomy status: Secondary | ICD-10-CM

## 2017-10-17 DIAGNOSIS — M109 Gout, unspecified: Secondary | ICD-10-CM | POA: Diagnosis not present

## 2017-10-17 DIAGNOSIS — J45909 Unspecified asthma, uncomplicated: Secondary | ICD-10-CM | POA: Diagnosis not present

## 2017-10-17 DIAGNOSIS — I129 Hypertensive chronic kidney disease with stage 1 through stage 4 chronic kidney disease, or unspecified chronic kidney disease: Secondary | ICD-10-CM | POA: Diagnosis not present

## 2017-10-17 DIAGNOSIS — K5730 Diverticulosis of large intestine without perforation or abscess without bleeding: Secondary | ICD-10-CM | POA: Diagnosis not present

## 2017-10-17 DIAGNOSIS — Z980 Intestinal bypass and anastomosis status: Secondary | ICD-10-CM | POA: Diagnosis not present

## 2017-10-17 DIAGNOSIS — Z48815 Encounter for surgical aftercare following surgery on the digestive system: Secondary | ICD-10-CM | POA: Diagnosis not present

## 2017-10-17 DIAGNOSIS — E119 Type 2 diabetes mellitus without complications: Secondary | ICD-10-CM | POA: Diagnosis not present

## 2017-10-17 DIAGNOSIS — N183 Chronic kidney disease, stage 3 (moderate): Secondary | ICD-10-CM | POA: Diagnosis not present

## 2017-10-20 ENCOUNTER — Encounter: Payer: Self-pay | Admitting: Surgery

## 2017-10-20 ENCOUNTER — Ambulatory Visit (INDEPENDENT_AMBULATORY_CARE_PROVIDER_SITE_OTHER): Payer: Medicare HMO | Admitting: Surgery

## 2017-10-20 VITALS — BP 118/78 | HR 84 | Temp 97.7°F | Wt 203.0 lb

## 2017-10-20 DIAGNOSIS — J45909 Unspecified asthma, uncomplicated: Secondary | ICD-10-CM | POA: Diagnosis not present

## 2017-10-20 DIAGNOSIS — K5720 Diverticulitis of large intestine with perforation and abscess without bleeding: Secondary | ICD-10-CM

## 2017-10-20 DIAGNOSIS — I129 Hypertensive chronic kidney disease with stage 1 through stage 4 chronic kidney disease, or unspecified chronic kidney disease: Secondary | ICD-10-CM | POA: Diagnosis not present

## 2017-10-20 DIAGNOSIS — K5730 Diverticulosis of large intestine without perforation or abscess without bleeding: Secondary | ICD-10-CM | POA: Diagnosis not present

## 2017-10-20 DIAGNOSIS — Z980 Intestinal bypass and anastomosis status: Secondary | ICD-10-CM | POA: Diagnosis not present

## 2017-10-20 DIAGNOSIS — Z48815 Encounter for surgical aftercare following surgery on the digestive system: Secondary | ICD-10-CM | POA: Diagnosis not present

## 2017-10-20 DIAGNOSIS — E119 Type 2 diabetes mellitus without complications: Secondary | ICD-10-CM | POA: Diagnosis not present

## 2017-10-20 DIAGNOSIS — M109 Gout, unspecified: Secondary | ICD-10-CM | POA: Diagnosis not present

## 2017-10-20 DIAGNOSIS — N183 Chronic kidney disease, stage 3 (moderate): Secondary | ICD-10-CM | POA: Diagnosis not present

## 2017-10-20 DIAGNOSIS — Z9049 Acquired absence of other specified parts of digestive tract: Secondary | ICD-10-CM | POA: Diagnosis not present

## 2017-10-20 NOTE — Patient Instructions (Addendum)
Please give [place] a call in case you have any questions or concerns. Return as needed.

## 2017-10-21 ENCOUNTER — Encounter: Payer: Self-pay | Admitting: Surgery

## 2017-10-21 NOTE — Progress Notes (Signed)
S/p ileostomy takedown by Dr. [name] Doing well +BM, + PO No fevers  PE NAd Abd: soft, nt, incisions c/d/i, no infection Staples removed  A/P Doing well No heavy lifting RTC prn

## 2017-10-22 DIAGNOSIS — I129 Hypertensive chronic kidney disease with stage 1 through stage 4 chronic kidney disease, or unspecified chronic kidney disease: Secondary | ICD-10-CM | POA: Diagnosis not present

## 2017-10-22 DIAGNOSIS — E119 Type 2 diabetes mellitus without complications: Secondary | ICD-10-CM | POA: Diagnosis not present

## 2017-10-22 DIAGNOSIS — Z9049 Acquired absence of other specified parts of digestive tract: Secondary | ICD-10-CM | POA: Diagnosis not present

## 2017-10-22 DIAGNOSIS — J45909 Unspecified asthma, uncomplicated: Secondary | ICD-10-CM | POA: Diagnosis not present

## 2017-10-22 DIAGNOSIS — M109 Gout, unspecified: Secondary | ICD-10-CM | POA: Diagnosis not present

## 2017-10-22 DIAGNOSIS — Z48815 Encounter for surgical aftercare following surgery on the digestive system: Secondary | ICD-10-CM | POA: Diagnosis not present

## 2017-10-22 DIAGNOSIS — K5730 Diverticulosis of large intestine without perforation or abscess without bleeding: Secondary | ICD-10-CM | POA: Diagnosis not present

## 2017-10-22 DIAGNOSIS — Z980 Intestinal bypass and anastomosis status: Secondary | ICD-10-CM | POA: Diagnosis not present

## 2017-10-22 DIAGNOSIS — N183 Chronic kidney disease, stage 3 (moderate): Secondary | ICD-10-CM | POA: Diagnosis not present

## 2017-10-25 DIAGNOSIS — E119 Type 2 diabetes mellitus without complications: Secondary | ICD-10-CM | POA: Diagnosis not present

## 2017-10-25 DIAGNOSIS — J45909 Unspecified asthma, uncomplicated: Secondary | ICD-10-CM | POA: Diagnosis not present

## 2017-10-25 DIAGNOSIS — Z980 Intestinal bypass and anastomosis status: Secondary | ICD-10-CM | POA: Diagnosis not present

## 2017-10-25 DIAGNOSIS — I129 Hypertensive chronic kidney disease with stage 1 through stage 4 chronic kidney disease, or unspecified chronic kidney disease: Secondary | ICD-10-CM | POA: Diagnosis not present

## 2017-10-25 DIAGNOSIS — K5730 Diverticulosis of large intestine without perforation or abscess without bleeding: Secondary | ICD-10-CM | POA: Diagnosis not present

## 2017-10-25 DIAGNOSIS — M109 Gout, unspecified: Secondary | ICD-10-CM | POA: Diagnosis not present

## 2017-10-25 DIAGNOSIS — Z9049 Acquired absence of other specified parts of digestive tract: Secondary | ICD-10-CM | POA: Diagnosis not present

## 2017-10-25 DIAGNOSIS — N183 Chronic kidney disease, stage 3 (moderate): Secondary | ICD-10-CM | POA: Diagnosis not present

## 2017-10-25 DIAGNOSIS — Z48815 Encounter for surgical aftercare following surgery on the digestive system: Secondary | ICD-10-CM | POA: Diagnosis not present

## 2017-10-26 ENCOUNTER — Encounter: Payer: Self-pay | Admitting: Surgery

## 2017-10-26 ENCOUNTER — Ambulatory Visit (INDEPENDENT_AMBULATORY_CARE_PROVIDER_SITE_OTHER): Payer: Medicare HMO | Admitting: Surgery

## 2017-10-26 VITALS — BP 146/81 | HR 75 | Temp 98.0°F | Wt 206.0 lb

## 2017-10-26 DIAGNOSIS — Z932 Ileostomy status: Secondary | ICD-10-CM

## 2017-10-26 NOTE — Patient Instructions (Signed)

## 2017-10-26 NOTE — Progress Notes (Signed)
Surgical Clinic Progress/Follow-up Note   HPI:  77 y.o. Female presents to clinic for post-op follow-up 2 weeks s/p reversal/closure of diverting loop ileostomy [name], 10/13/2017) created at time of sigmoid colonic resection for perforated diverticulitis with large LLQ abdominal wall abscess. Patient reports complete resolution of peri-operative peri-incisional pain and has been tolerating regular diet with +flatus and normal BM's, denies N/V, fever/chills, CP, or SOB.  Review of Systems:  Constitutional: denies fever/chills  Respiratory: denies shortness of breath, wheezing  Cardiovascular: denies chest pain, palpitations  Gastrointestinal: abdominal pain, N/V, and bowel function as per interval history Skin: Denies any other rashes or skin discolorations except post-surgical wounds as per interval history  Vital Signs:  BP (!) 146/81   Pulse 75   Temp 98 F (36.7 C) (Oral)   Wt 206 lb (93.4 kg)   BMI 34.28 kg/m    Physical Exam:  Constitutional:  -- Obese body habitus  -- Awake, alert, and oriented x3  Pulmonary:  -- No crackles -- Equal breath sounds bilaterally -- Breathing non-labored at rest Cardiovascular:  -- S1, S2 present  -- No pericardial rubs  Gastrointestinal:  -- Soft and non-distended, non-tender to palpation, no guarding/rebound tenderness -- Post-surgical incisions all well-approximated without any peri-incisional erythema or drainage -- No abdominal masses appreciated, pulsatile or otherwise  Musculoskeletal / Integumentary:  -- Wounds or skin discoloration: None appreciated except post-surgical incisions as described above (GI) -- Extremities: B/L UE and LE FROM, hands and feet warm, no edema   Assessment:  78 y.o. yo Female with a problem list including...  Patient Active Problem List   Diagnosis Date Noted  . Ileostomy present ([facility])   . Hyperkalemia 10/11/2017  . History of colon resection   . Diverticulosis of large intestine without  diverticulitis   . Intestinal bypass or anastomosis status   . Diabetes ([facility]) 07/15/2017  . HTN (hypertension) 07/15/2017  . Gout 07/15/2017  . Acute renal failure superimposed on chronic kidney disease ([place]) 07/15/2017  . CKD (chronic kidney disease) stage 3, GFR 30-59 ml/min ([facility]) 01/21/2015  . Asthma, well controlled 01/21/2015  . Acute kidney injury (nontraumatic) ([place]) 01/13/2015    presents to clinic for post-op follow-up evaluation, doing well 2 weeks s/p reversal/closure of diverting loop ileostomy [name], 10/13/2017) created at time of sigmoid colonic resection for perforated diverticulitis with large LLQ abdominal wall abscess and 1 weeks s/p removal of surgical skin staples.  Plan:              - advance diet as tolerated              - okay to submerge incisions under water (baths, swimming) prn             - no heavy lifting >40 lbs x 4 more weeks, after which gradually resume all activities without restrictions             - apply sunblock particularly to incisions with sun exposure to reduce pigmentation of scars             - return to clinic as needed, instructed to call office if any questions or concerns  All of the above recommendations were discussed with the patient, and all of patient's questions were answered to her expressed satisfaction.  -- Marilynne Drivers [name], MD, [place]: [facility] - Partnering for exceptional care. Office: [phone]

## 2017-10-28 DIAGNOSIS — K5730 Diverticulosis of large intestine without perforation or abscess without bleeding: Secondary | ICD-10-CM | POA: Diagnosis not present

## 2017-10-28 DIAGNOSIS — N183 Chronic kidney disease, stage 3 (moderate): Secondary | ICD-10-CM | POA: Diagnosis not present

## 2017-10-28 DIAGNOSIS — Z48815 Encounter for surgical aftercare following surgery on the digestive system: Secondary | ICD-10-CM | POA: Diagnosis not present

## 2017-10-28 DIAGNOSIS — M109 Gout, unspecified: Secondary | ICD-10-CM | POA: Diagnosis not present

## 2017-10-28 DIAGNOSIS — I129 Hypertensive chronic kidney disease with stage 1 through stage 4 chronic kidney disease, or unspecified chronic kidney disease: Secondary | ICD-10-CM | POA: Diagnosis not present

## 2017-10-28 DIAGNOSIS — J45909 Unspecified asthma, uncomplicated: Secondary | ICD-10-CM | POA: Diagnosis not present

## 2017-10-28 DIAGNOSIS — Z9049 Acquired absence of other specified parts of digestive tract: Secondary | ICD-10-CM | POA: Diagnosis not present

## 2017-10-28 DIAGNOSIS — E119 Type 2 diabetes mellitus without complications: Secondary | ICD-10-CM | POA: Diagnosis not present

## 2017-10-28 DIAGNOSIS — Z980 Intestinal bypass and anastomosis status: Secondary | ICD-10-CM | POA: Diagnosis not present

## 2017-10-29 DIAGNOSIS — N184 Chronic kidney disease, stage 4 (severe): Secondary | ICD-10-CM | POA: Diagnosis not present

## 2017-10-29 DIAGNOSIS — N179 Acute kidney failure, unspecified: Secondary | ICD-10-CM | POA: Diagnosis not present

## 2017-10-29 DIAGNOSIS — K5792 Diverticulitis of intestine, part unspecified, without perforation or abscess without bleeding: Secondary | ICD-10-CM | POA: Diagnosis not present

## 2017-11-01 DIAGNOSIS — E119 Type 2 diabetes mellitus without complications: Secondary | ICD-10-CM | POA: Diagnosis not present

## 2017-11-01 DIAGNOSIS — Z9049 Acquired absence of other specified parts of digestive tract: Secondary | ICD-10-CM | POA: Diagnosis not present

## 2017-11-01 DIAGNOSIS — I129 Hypertensive chronic kidney disease with stage 1 through stage 4 chronic kidney disease, or unspecified chronic kidney disease: Secondary | ICD-10-CM | POA: Diagnosis not present

## 2017-11-01 DIAGNOSIS — K5730 Diverticulosis of large intestine without perforation or abscess without bleeding: Secondary | ICD-10-CM | POA: Diagnosis not present

## 2017-11-01 DIAGNOSIS — J45909 Unspecified asthma, uncomplicated: Secondary | ICD-10-CM | POA: Diagnosis not present

## 2017-11-01 DIAGNOSIS — N183 Chronic kidney disease, stage 3 (moderate): Secondary | ICD-10-CM | POA: Diagnosis not present

## 2017-11-01 DIAGNOSIS — Z980 Intestinal bypass and anastomosis status: Secondary | ICD-10-CM | POA: Diagnosis not present

## 2017-11-01 DIAGNOSIS — Z48815 Encounter for surgical aftercare following surgery on the digestive system: Secondary | ICD-10-CM | POA: Diagnosis not present

## 2017-11-01 DIAGNOSIS — M109 Gout, unspecified: Secondary | ICD-10-CM | POA: Diagnosis not present

## 2017-11-04 DIAGNOSIS — E119 Type 2 diabetes mellitus without complications: Secondary | ICD-10-CM | POA: Diagnosis not present

## 2017-11-04 DIAGNOSIS — N183 Chronic kidney disease, stage 3 (moderate): Secondary | ICD-10-CM | POA: Diagnosis not present

## 2017-11-04 DIAGNOSIS — Z48815 Encounter for surgical aftercare following surgery on the digestive system: Secondary | ICD-10-CM | POA: Diagnosis not present

## 2017-11-04 DIAGNOSIS — J45909 Unspecified asthma, uncomplicated: Secondary | ICD-10-CM | POA: Diagnosis not present

## 2017-11-04 DIAGNOSIS — I129 Hypertensive chronic kidney disease with stage 1 through stage 4 chronic kidney disease, or unspecified chronic kidney disease: Secondary | ICD-10-CM | POA: Diagnosis not present

## 2017-11-04 DIAGNOSIS — M109 Gout, unspecified: Secondary | ICD-10-CM | POA: Diagnosis not present

## 2017-11-04 DIAGNOSIS — Z9049 Acquired absence of other specified parts of digestive tract: Secondary | ICD-10-CM | POA: Diagnosis not present

## 2017-11-04 DIAGNOSIS — K5730 Diverticulosis of large intestine without perforation or abscess without bleeding: Secondary | ICD-10-CM | POA: Diagnosis not present

## 2017-11-04 DIAGNOSIS — Z980 Intestinal bypass and anastomosis status: Secondary | ICD-10-CM | POA: Diagnosis not present

## 2017-11-25 DIAGNOSIS — I10 Essential (primary) hypertension: Secondary | ICD-10-CM | POA: Diagnosis not present

## 2017-11-25 DIAGNOSIS — N183 Chronic kidney disease, stage 3 (moderate): Secondary | ICD-10-CM | POA: Diagnosis not present

## 2017-11-25 DIAGNOSIS — E119 Type 2 diabetes mellitus without complications: Secondary | ICD-10-CM | POA: Diagnosis not present

## 2017-11-25 DIAGNOSIS — Z79899 Other long term (current) drug therapy: Secondary | ICD-10-CM | POA: Diagnosis not present

## 2017-12-03 DIAGNOSIS — Z79899 Other long term (current) drug therapy: Secondary | ICD-10-CM | POA: Diagnosis not present

## 2017-12-03 DIAGNOSIS — Z0000 Encounter for general adult medical examination without abnormal findings: Secondary | ICD-10-CM | POA: Diagnosis not present

## 2019-07-10 IMAGING — CT CT ABD-PELV W/O CM
2 of 4 series · 16 of 46 positions shown, 18 images · non-contrast
Comparison: None.

CLINICAL DATA: 76-year-old female with acute LEFT abdominal and
pelvic pain for 10 days.

EXAM:
CT ABDOMEN AND PELVIS WITHOUT CONTRAST
TECHNIQUE: Multidetector CT imaging of the abdomen and pelvis was performed
following the standard protocol without IV contrast.

[Series 2: routine abd/pel wo · axial · 0.76mm/px · z∈[-566,-141]mm · 13 of 95 slices shown, 15 images]
[im 5/95  soft-tissue]
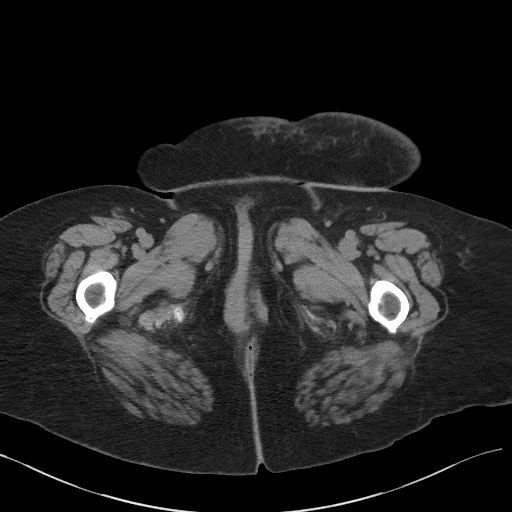
[im 5/95  bone]
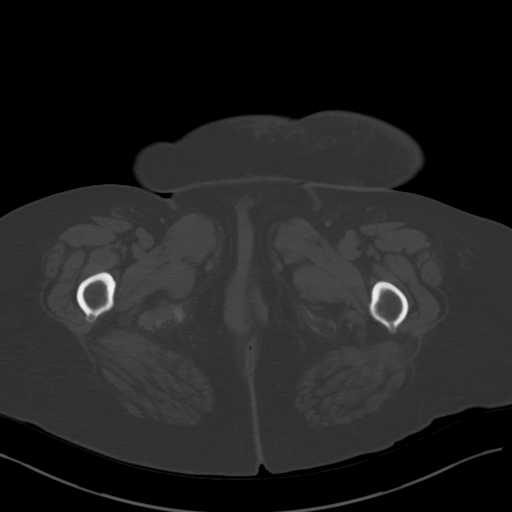
[im 13/95  soft-tissue]
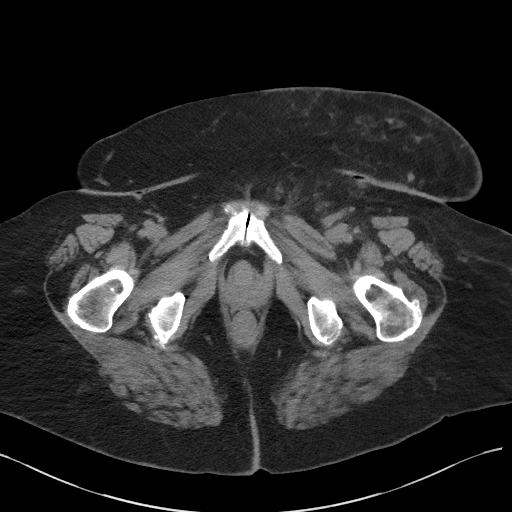
[im 21/95  soft-tissue]
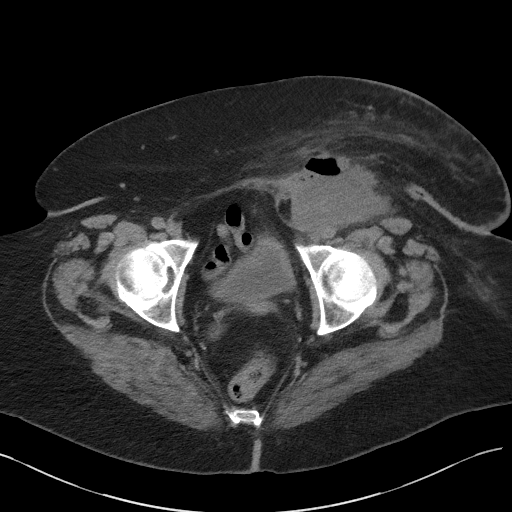
[im 25/95  soft-tissue]
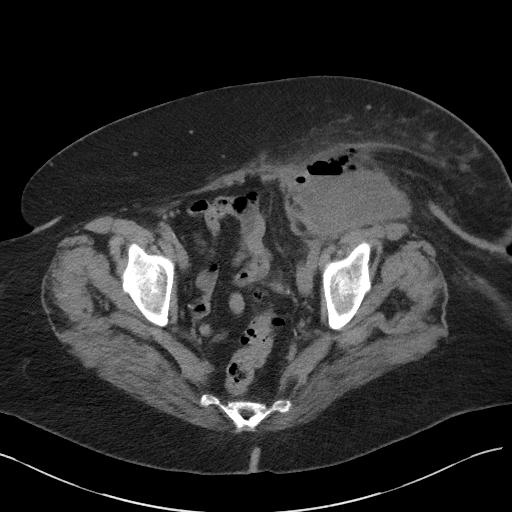
[im 33/95  soft-tissue]
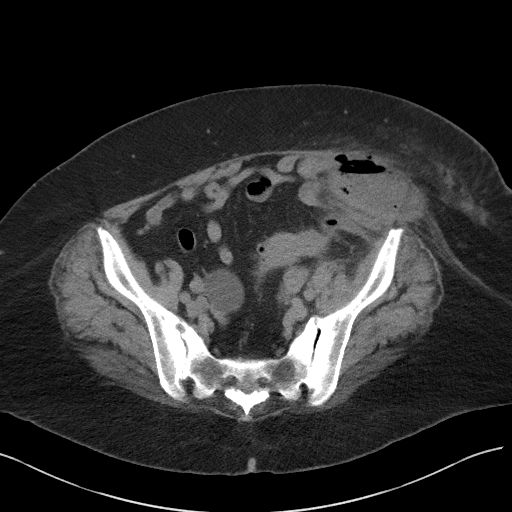
[im 41/95  soft-tissue]
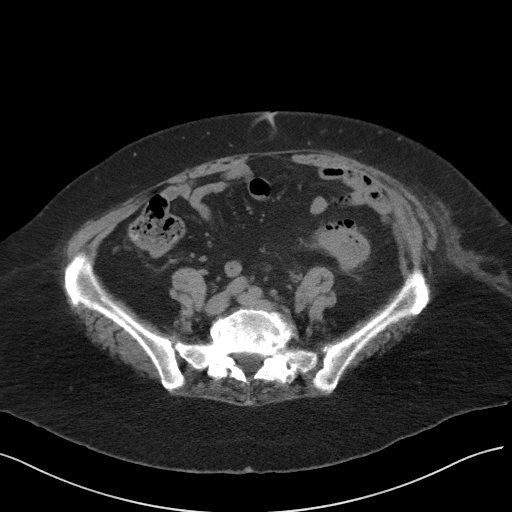
[im 50/95  soft-tissue]
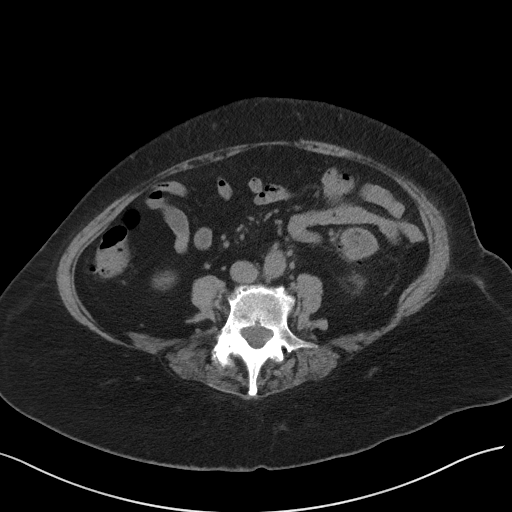
[im 54/95  soft-tissue]
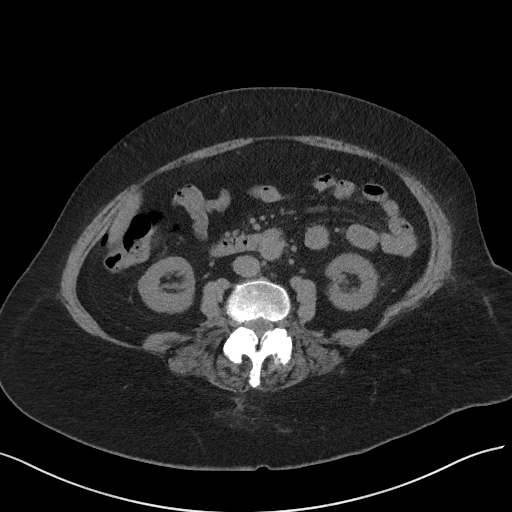
[im 62/95  soft-tissue]
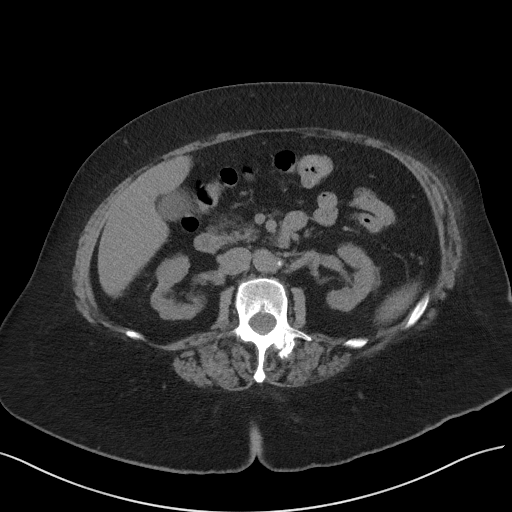
[im 62/95  bone]
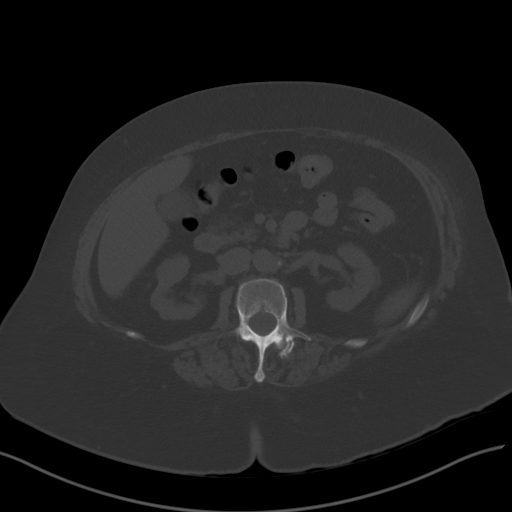
[im 70/95  soft-tissue]
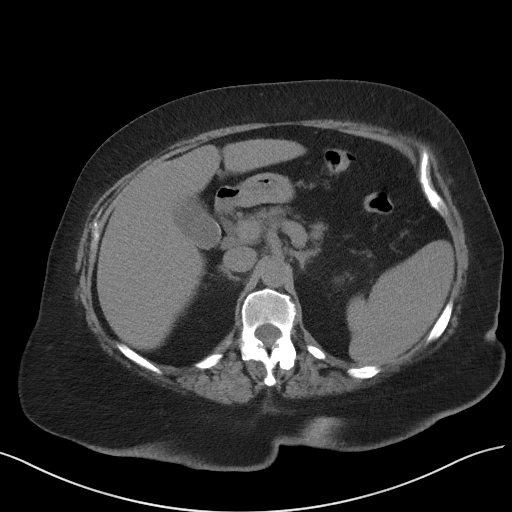
[im 74/95  soft-tissue]
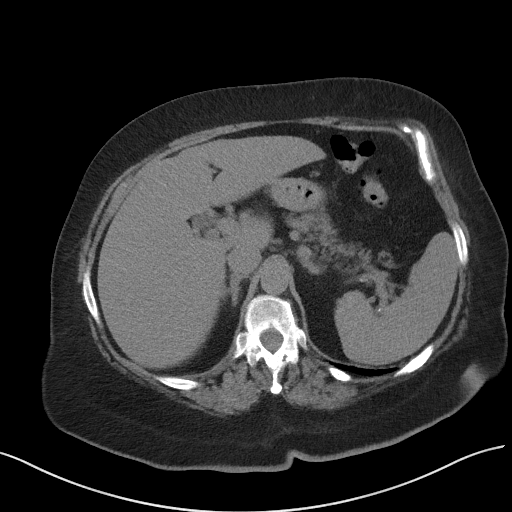
[im 82/95  soft-tissue]
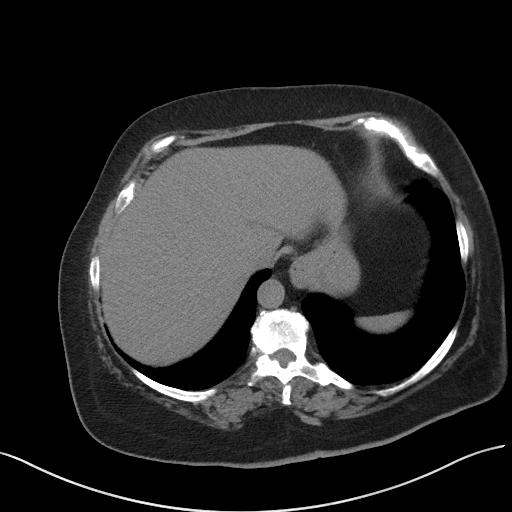
[im 90/95  soft-tissue]
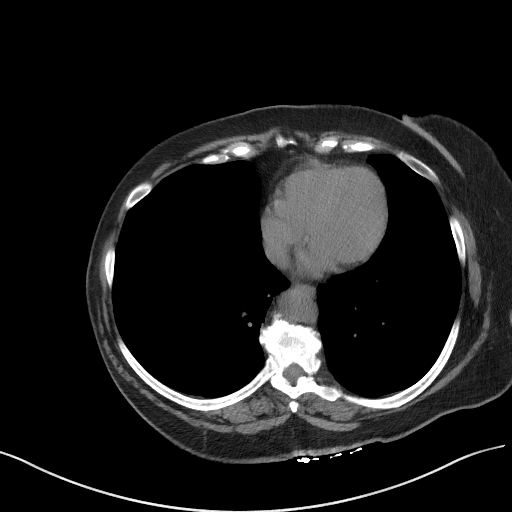

[Series 5: coronal st · coronal · 0.81mm/px · 3 of 93 slices shown]
[im 31/93  soft-tissue]
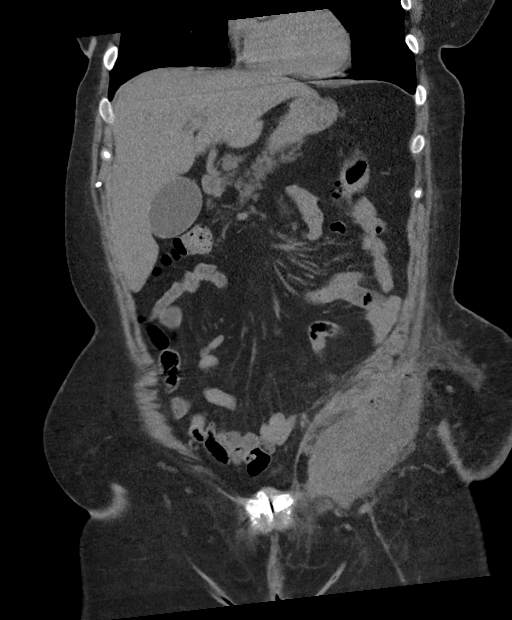
[im 41/93  soft-tissue]
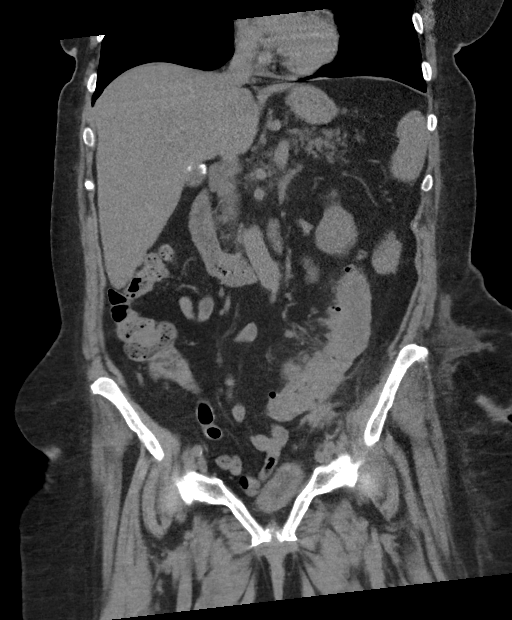
[im 52/93  soft-tissue]
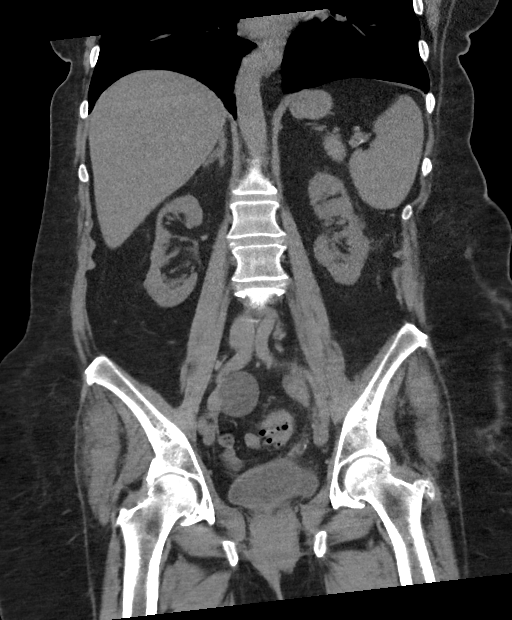

[16 of 46 positions shown; findings below may reference images not displayed]

FINDINGS: Please note that parenchymal abnormalities may be missed without
intravenous contrast.

Lower chest: No acute abnormality

Hepatobiliary: Mild hepatic steatosis noted without focal hepatic
abnormality. Cholelithiasis identified without CT evidence of acute
cholecystitis. No biliary dilatation.

Pancreas: Atrophic without other significant abnormality.

Spleen: Unremarkable

Adrenals/Urinary Tract: Bilateral renal atrophy identified. No
hydronephrosis, renal calculi or adrenal abnormality. The bladder is
unremarkable.

Stomach/Bowel: A 9.1 x 6.5 x 12.5 cm collection with gas along the
low LEFT pelvic wall is compatible with an abscess. There appears to
be a connection of this abscess to the mid sigmoid colon. There is
no evidence of bowel obstruction. Colonic diverticulosis noted. The
appendix is normal.

Vascular/Lymphatic: Aortic atherosclerosis. No enlarged abdominal or
pelvic lymph nodes.

Reproductive: A 3.8 cm RIGHT ovarian cyst is present. The LEFT ovary
is unremarkable. Patient is status post hysterectomy.

Other: No ascites or pneumoperitoneum.

Musculoskeletal: No acute or suspicious bony abnormalities.
Degenerative changes within the lumbar spine are noted.
IMPRESSION: 1. 9.1 x 6.5 x 12.5 cm abscess along the low LEFT pelvic wall with
apparent connection to the mid sigmoid colon which is likely the
source.
2. 3.8 cm RIGHT ovarian cyst. Pelvic ultrasound evaluation
recommended.
3. Hepatic steatosis.
4. Cholelithiasis.
5.  Aortic Atherosclerosis ([id number].Z).

## 2019-12-28 DIAGNOSIS — E1122 Type 2 diabetes mellitus with diabetic chronic kidney disease: Secondary | ICD-10-CM | POA: Diagnosis not present

## 2019-12-28 DIAGNOSIS — N1832 Chronic kidney disease, stage 3b: Secondary | ICD-10-CM | POA: Diagnosis not present

## 2020-01-04 DIAGNOSIS — I129 Hypertensive chronic kidney disease with stage 1 through stage 4 chronic kidney disease, or unspecified chronic kidney disease: Secondary | ICD-10-CM | POA: Diagnosis not present

## 2020-01-04 DIAGNOSIS — Z0000 Encounter for general adult medical examination without abnormal findings: Secondary | ICD-10-CM | POA: Diagnosis not present

## 2020-01-04 DIAGNOSIS — Z1331 Encounter for screening for depression: Secondary | ICD-10-CM | POA: Diagnosis not present

## 2020-01-04 DIAGNOSIS — N184 Chronic kidney disease, stage 4 (severe): Secondary | ICD-10-CM | POA: Diagnosis not present

## 2020-01-04 DIAGNOSIS — E1122 Type 2 diabetes mellitus with diabetic chronic kidney disease: Secondary | ICD-10-CM | POA: Diagnosis not present

## 2021-03-10 DIAGNOSIS — Z0000 Encounter for general adult medical examination without abnormal findings: Secondary | ICD-10-CM | POA: Diagnosis not present

## 2021-03-10 DIAGNOSIS — Z1389 Encounter for screening for other disorder: Secondary | ICD-10-CM | POA: Diagnosis not present

## 2021-03-10 DIAGNOSIS — Z6841 Body Mass Index (BMI) 40.0 and over, adult: Secondary | ICD-10-CM | POA: Diagnosis not present

## 2021-03-10 DIAGNOSIS — E1122 Type 2 diabetes mellitus with diabetic chronic kidney disease: Secondary | ICD-10-CM | POA: Diagnosis not present

## 2021-03-10 DIAGNOSIS — E119 Type 2 diabetes mellitus without complications: Secondary | ICD-10-CM | POA: Diagnosis not present

## 2021-03-10 DIAGNOSIS — N184 Chronic kidney disease, stage 4 (severe): Secondary | ICD-10-CM | POA: Diagnosis not present

## 2021-03-10 DIAGNOSIS — I10 Essential (primary) hypertension: Secondary | ICD-10-CM | POA: Diagnosis not present

## 2021-03-10 DIAGNOSIS — Z1331 Encounter for screening for depression: Secondary | ICD-10-CM | POA: Diagnosis not present

## 2021-09-01 DIAGNOSIS — I10 Essential (primary) hypertension: Secondary | ICD-10-CM | POA: Diagnosis not present

## 2021-09-01 DIAGNOSIS — N184 Chronic kidney disease, stage 4 (severe): Secondary | ICD-10-CM | POA: Diagnosis not present

## 2021-09-01 DIAGNOSIS — E1122 Type 2 diabetes mellitus with diabetic chronic kidney disease: Secondary | ICD-10-CM | POA: Diagnosis not present

## 2021-09-08 DIAGNOSIS — Z79899 Other long term (current) drug therapy: Secondary | ICD-10-CM | POA: Diagnosis not present

## 2021-09-08 DIAGNOSIS — I129 Hypertensive chronic kidney disease with stage 1 through stage 4 chronic kidney disease, or unspecified chronic kidney disease: Secondary | ICD-10-CM | POA: Diagnosis not present

## 2021-09-08 DIAGNOSIS — N189 Chronic kidney disease, unspecified: Secondary | ICD-10-CM | POA: Diagnosis not present

## 2021-09-08 DIAGNOSIS — Z6841 Body Mass Index (BMI) 40.0 and over, adult: Secondary | ICD-10-CM | POA: Diagnosis not present

## 2021-09-08 DIAGNOSIS — E1122 Type 2 diabetes mellitus with diabetic chronic kidney disease: Secondary | ICD-10-CM | POA: Diagnosis not present

## 2022-03-03 DIAGNOSIS — Z79899 Other long term (current) drug therapy: Secondary | ICD-10-CM | POA: Diagnosis not present

## 2022-03-03 DIAGNOSIS — E119 Type 2 diabetes mellitus without complications: Secondary | ICD-10-CM | POA: Diagnosis not present

## 2022-03-03 DIAGNOSIS — N1831 Chronic kidney disease, stage 3a: Secondary | ICD-10-CM | POA: Diagnosis not present

## 2022-03-03 DIAGNOSIS — I10 Essential (primary) hypertension: Secondary | ICD-10-CM | POA: Diagnosis not present

## 2022-03-10 DIAGNOSIS — Z1331 Encounter for screening for depression: Secondary | ICD-10-CM | POA: Diagnosis not present

## 2022-03-10 DIAGNOSIS — Z0000 Encounter for general adult medical examination without abnormal findings: Secondary | ICD-10-CM | POA: Diagnosis not present

## 2022-03-10 DIAGNOSIS — N189 Chronic kidney disease, unspecified: Secondary | ICD-10-CM | POA: Diagnosis not present

## 2022-09-02 DIAGNOSIS — Z79899 Other long term (current) drug therapy: Secondary | ICD-10-CM | POA: Diagnosis not present

## 2022-09-02 DIAGNOSIS — N184 Chronic kidney disease, stage 4 (severe): Secondary | ICD-10-CM | POA: Diagnosis not present

## 2022-09-02 DIAGNOSIS — E1122 Type 2 diabetes mellitus with diabetic chronic kidney disease: Secondary | ICD-10-CM | POA: Diagnosis not present

## 2022-09-10 DIAGNOSIS — Z79899 Other long term (current) drug therapy: Secondary | ICD-10-CM | POA: Diagnosis not present

## 2022-09-10 DIAGNOSIS — I129 Hypertensive chronic kidney disease with stage 1 through stage 4 chronic kidney disease, or unspecified chronic kidney disease: Secondary | ICD-10-CM | POA: Diagnosis not present

## 2022-09-10 DIAGNOSIS — E1122 Type 2 diabetes mellitus with diabetic chronic kidney disease: Secondary | ICD-10-CM | POA: Diagnosis not present

## 2022-09-10 DIAGNOSIS — N189 Chronic kidney disease, unspecified: Secondary | ICD-10-CM | POA: Diagnosis not present

## 2022-11-26 DIAGNOSIS — H524 Presbyopia: Secondary | ICD-10-CM | POA: Diagnosis not present

## 2023-01-16 ENCOUNTER — Emergency Department: Payer: Medicare HMO

## 2023-01-16 ENCOUNTER — Other Ambulatory Visit: Payer: Self-pay

## 2023-01-16 ENCOUNTER — Emergency Department
Admission: EM | Admit: 2023-01-16 | Discharge: 2023-01-16 | Disposition: A | Discharge status: Home / Self Care | Payer: Medicare HMO | Attending: Emergency Medicine | Admitting: Emergency Medicine

## 2023-01-16 DIAGNOSIS — S79912A Unspecified injury of left hip, initial encounter: Secondary | ICD-10-CM | POA: Diagnosis present

## 2023-01-16 DIAGNOSIS — N189 Chronic kidney disease, unspecified: Secondary | ICD-10-CM | POA: Insufficient documentation

## 2023-01-16 DIAGNOSIS — S7002XA Contusion of left hip, initial encounter: Secondary | ICD-10-CM | POA: Diagnosis not present

## 2023-01-16 DIAGNOSIS — I129 Hypertensive chronic kidney disease with stage 1 through stage 4 chronic kidney disease, or unspecified chronic kidney disease: Secondary | ICD-10-CM | POA: Insufficient documentation

## 2023-01-16 DIAGNOSIS — W108XXA Fall (on) (from) other stairs and steps, initial encounter: Secondary | ICD-10-CM | POA: Diagnosis not present

## 2023-01-16 DIAGNOSIS — E1122 Type 2 diabetes mellitus with diabetic chronic kidney disease: Secondary | ICD-10-CM | POA: Diagnosis not present

## 2023-01-16 DIAGNOSIS — M25552 Pain in left hip: Secondary | ICD-10-CM | POA: Diagnosis not present

## 2023-01-16 DIAGNOSIS — M1612 Unilateral primary osteoarthritis, left hip: Secondary | ICD-10-CM | POA: Diagnosis not present

## 2023-01-16 DIAGNOSIS — K439 Ventral hernia without obstruction or gangrene: Secondary | ICD-10-CM | POA: Diagnosis not present

## 2023-01-16 DIAGNOSIS — W19XXXA Unspecified fall, initial encounter: Secondary | ICD-10-CM

## 2023-01-16 DIAGNOSIS — K5730 Diverticulosis of large intestine without perforation or abscess without bleeding: Secondary | ICD-10-CM | POA: Diagnosis not present

## 2023-01-16 DIAGNOSIS — J45909 Unspecified asthma, uncomplicated: Secondary | ICD-10-CM | POA: Insufficient documentation

## 2023-01-16 NOTE — ED Provider Notes (Signed)
[facility] Provider Note    Event Date/Time   First MD Initiated Contact with Patient 01/16/23 1120     (approximate)   History   Fall   HPI  Brenda Gregory is a 82 y.o. female   presents to the ED with complaint of left hip pain after a fall that occurred this morning.  Patient states that she was getting into her car and missed a step off the curb being landing on the ground.  Patient denies any head injury or loss of consciousness.  She was able with assistance to get into a car to come to the emergency department.  Patient has a history of diabetes, hypertension, CKD, asthma, diverticulosis and history of colon resection.      Physical Exam   Triage Vital Signs: ED Triage Vitals [01/16/23 1110]  Encounter Vitals Group     BP (!) 159/111     Systolic BP Percentile      Diastolic BP Percentile      Pulse Rate 74     Resp 16     Temp 97.7 F (36.5 C)     Temp src      SpO2 94 %     Weight 235 lb (106.6 kg)     Height 5\' 5"  (1.651 m)     Head Circumference      Peak Flow      Pain Score 5     Pain Loc      Pain Education      Exclude from Growth Chart     Most recent vital signs: Vitals:   01/16/23 1110 01/16/23 1355  BP: (!) 159/111 (!) 177/81  Pulse: 74 75  Resp: 16 17  Temp: 97.7 F (36.5 C)   SpO2: 94% 95%     General: Awake, no distress.  Alert, oriented, talkative and able to answer questions appropriately. CV:  Good peripheral perfusion.  Heart rate and rate rhythm. Resp:  Normal effort.  Lungs are clear bilaterally. Abd:  No distention.  Soft, nontender. Other:  Moderate tenderness on palpation of the left lateral hip.  No shortening or rotation is noted of the left lower extremity.  Range of motion reproduces pain to the left lateral hip area and is slightly limited to increased pain.  Nontender knees bilaterally and no effusion is noted.  No thoracic or lumbar spine tenderness is appreciated on palpation.   ED  Results / Procedures / Treatments   Labs (all labs ordered are listed, but only abnormal results are displayed) Labs Reviewed - No data to display   RADIOLOGY  Left hip x-ray images were reviewed interpreted by myself independent of the radiologist with a questionable fracture noted however in looking at the radiology report he also suspects a nondisplaced fracture through the left inferior pubic ramus.  Radiology requesting a CT scan.  CT pelvis without contrast per radiologist is negative for the suspected nondisplaced fracture of the left inferior pelvic ramus.   PROCEDURES:  Critical Care performed:   Procedures   MEDICATIONS ORDERED IN ED: Medications - No data to display   IMPRESSION / MDM / ASSESSMENT AND PLAN / ED COURSE  I reviewed the triage vital signs and the nursing notes.   Differential diagnosis includes, but is not limited to, fracture left hip, contusion, pelvic fracture, contusion, left lower extremity strain/sprain.  82 year old female presents to the ED after a fall that occurred this morning when she was going to work.  Patient complains of left hip pain after she fell getting into her car and tripping over the curb.  Initial x-ray shows suspected nondisplaced fracture however CT scan showed no fracture and patient with family was made aware.  We discussed using her walker for the next several days if she is going be extremely sore and tender rather than using a cane.  Also note to remain out of work for the next several days.  Patient at this time states she has not needed the pain medication and did not request or need any while in the emergency department.  She is instructed to continue with her regular medications and follow-up with her PCP.      Patient's presentation is most consistent with acute illness / injury with system symptoms.  FINAL CLINICAL IMPRESSION(S) / ED DIAGNOSES   Final diagnoses:  Contusion of left hip, initial encounter  Fall,  initial encounter     Rx / DC Orders   ED Discharge Orders     None        Note:  This document was prepared using Dragon voice recognition software and may include unintentional dictation errors.   Tommi Rumps, PA-C 01/16/23 1524    Trinna Post, MD 01/18/23 0000

## 2023-01-16 NOTE — ED Triage Notes (Signed)
Pt to ED For fall today, missed a step at apartment. Denies hitting head or LOC. C/o pain to left buttocks.

## 2023-01-16 NOTE — Discharge Instructions (Signed)
Follow with your primary care provider if any continued problems or concerns.  Ice to your hip area as needed for pain or swelling.  Use your walker when up walking as you will be sore and stiff for several days and will need the extra support and protection.  Continue with your regular medication.

## 2023-01-19 DIAGNOSIS — E119 Type 2 diabetes mellitus without complications: Secondary | ICD-10-CM | POA: Diagnosis not present

## 2023-01-19 DIAGNOSIS — Z6841 Body Mass Index (BMI) 40.0 and over, adult: Secondary | ICD-10-CM | POA: Diagnosis not present

## 2023-01-19 DIAGNOSIS — M7918 Myalgia, other site: Secondary | ICD-10-CM | POA: Diagnosis not present

## 2023-01-19 DIAGNOSIS — M25552 Pain in left hip: Secondary | ICD-10-CM | POA: Diagnosis not present

## 2023-03-09 DIAGNOSIS — E1122 Type 2 diabetes mellitus with diabetic chronic kidney disease: Secondary | ICD-10-CM | POA: Diagnosis not present

## 2023-03-09 DIAGNOSIS — N184 Chronic kidney disease, stage 4 (severe): Secondary | ICD-10-CM | POA: Diagnosis not present

## 2023-03-16 DIAGNOSIS — Z1331 Encounter for screening for depression: Secondary | ICD-10-CM | POA: Diagnosis not present

## 2023-03-16 DIAGNOSIS — Z0000 Encounter for general adult medical examination without abnormal findings: Secondary | ICD-10-CM | POA: Diagnosis not present

## 2023-03-16 DIAGNOSIS — Z79899 Other long term (current) drug therapy: Secondary | ICD-10-CM | POA: Diagnosis not present

## 2023-03-16 DIAGNOSIS — Z6841 Body Mass Index (BMI) 40.0 and over, adult: Secondary | ICD-10-CM | POA: Diagnosis not present

## 2023-09-09 DIAGNOSIS — E1122 Type 2 diabetes mellitus with diabetic chronic kidney disease: Secondary | ICD-10-CM | POA: Diagnosis not present

## 2023-09-09 DIAGNOSIS — N184 Chronic kidney disease, stage 4 (severe): Secondary | ICD-10-CM | POA: Diagnosis not present

## 2023-09-09 DIAGNOSIS — Z79899 Other long term (current) drug therapy: Secondary | ICD-10-CM | POA: Diagnosis not present

## 2023-11-16 DIAGNOSIS — N189 Chronic kidney disease, unspecified: Secondary | ICD-10-CM | POA: Diagnosis not present

## 2023-11-16 DIAGNOSIS — Z79899 Other long term (current) drug therapy: Secondary | ICD-10-CM | POA: Diagnosis not present

## 2023-11-16 DIAGNOSIS — I129 Hypertensive chronic kidney disease with stage 1 through stage 4 chronic kidney disease, or unspecified chronic kidney disease: Secondary | ICD-10-CM | POA: Diagnosis not present

## 2023-11-16 DIAGNOSIS — E1122 Type 2 diabetes mellitus with diabetic chronic kidney disease: Secondary | ICD-10-CM | POA: Diagnosis not present
